# Patient Record
Sex: Female | Born: 1955
Health system: Southern US, Community
[De-identification: ages and names within clinical notes are randomized; demographics above are authoritative.]

## PROBLEM LIST (undated history)

## (undated) DIAGNOSIS — Z148 Genetic carrier of other disease: Secondary | ICD-10-CM

## (undated) DIAGNOSIS — J45909 Unspecified asthma, uncomplicated: Secondary | ICD-10-CM

## (undated) DIAGNOSIS — K5900 Constipation, unspecified: Secondary | ICD-10-CM

## (undated) HISTORY — PX: APPENDECTOMY: SHX54

## (undated) HISTORY — PX: ABDOMINAL HYSTERECTOMY: SHX81

---

## 2020-11-01 ENCOUNTER — Encounter (HOSPITAL_COMMUNITY)
Admission: EM | Disposition: A | Discharge status: Home / Self Care | Payer: Self-pay | Source: Home / Self Care | Attending: Family Medicine

## 2020-11-01 ENCOUNTER — Emergency Department (HOSPITAL_COMMUNITY): Payer: Medicare Other

## 2020-11-01 ENCOUNTER — Inpatient Hospital Stay (HOSPITAL_COMMUNITY): Payer: Medicare Other | Admitting: Anesthesiology

## 2020-11-01 ENCOUNTER — Inpatient Hospital Stay (HOSPITAL_COMMUNITY): Payer: Medicare Other

## 2020-11-01 ENCOUNTER — Inpatient Hospital Stay (HOSPITAL_COMMUNITY)
Admission: EM | Admit: 2020-11-01 | Discharge: 2020-11-07 | DRG: 331 | Disposition: A | Discharge status: Home / Self Care | Payer: Medicare Other | Attending: General Surgery | Admitting: General Surgery

## 2020-11-01 ENCOUNTER — Encounter (HOSPITAL_COMMUNITY): Payer: Self-pay

## 2020-11-01 ENCOUNTER — Other Ambulatory Visit: Payer: Self-pay

## 2020-11-01 DIAGNOSIS — Z79899 Other long term (current) drug therapy: Secondary | ICD-10-CM

## 2020-11-01 DIAGNOSIS — K562 Volvulus: Principal | ICD-10-CM | POA: Diagnosis present

## 2020-11-01 DIAGNOSIS — Z7951 Long term (current) use of inhaled steroids: Secondary | ICD-10-CM | POA: Diagnosis not present

## 2020-11-01 DIAGNOSIS — Z20822 Contact with and (suspected) exposure to covid-19: Secondary | ICD-10-CM | POA: Diagnosis present

## 2020-11-01 DIAGNOSIS — Z90710 Acquired absence of both cervix and uterus: Secondary | ICD-10-CM

## 2020-11-01 DIAGNOSIS — J4520 Mild intermittent asthma, uncomplicated: Secondary | ICD-10-CM | POA: Diagnosis present

## 2020-11-01 DIAGNOSIS — Z90721 Acquired absence of ovaries, unilateral: Secondary | ICD-10-CM | POA: Diagnosis not present

## 2020-11-01 DIAGNOSIS — K5900 Constipation, unspecified: Secondary | ICD-10-CM | POA: Diagnosis not present

## 2020-11-01 DIAGNOSIS — J45909 Unspecified asthma, uncomplicated: Secondary | ICD-10-CM | POA: Diagnosis present

## 2020-11-01 DIAGNOSIS — K219 Gastro-esophageal reflux disease without esophagitis: Secondary | ICD-10-CM | POA: Diagnosis present

## 2020-11-01 DIAGNOSIS — Z91011 Allergy to milk products: Secondary | ICD-10-CM

## 2020-11-01 DIAGNOSIS — Z86010 Personal history of colonic polyps: Secondary | ICD-10-CM | POA: Diagnosis not present

## 2020-11-01 DIAGNOSIS — Z148 Genetic carrier of other disease: Secondary | ICD-10-CM

## 2020-11-01 DIAGNOSIS — K581 Irritable bowel syndrome with constipation: Secondary | ICD-10-CM | POA: Diagnosis present

## 2020-11-01 DIAGNOSIS — Q992 Fragile X chromosome: Secondary | ICD-10-CM | POA: Diagnosis not present

## 2020-11-01 HISTORY — PX: FLEXIBLE SIGMOIDOSCOPY: SHX5431

## 2020-11-01 HISTORY — DX: Unspecified asthma, uncomplicated: J45.909

## 2020-11-01 HISTORY — DX: Genetic carrier of other disease: Z14.8

## 2020-11-01 HISTORY — DX: Constipation, unspecified: K59.00

## 2020-11-01 LAB — COMPREHENSIVE METABOLIC PANEL
ALT: 19 U/L (ref 0–44)
AST: 25 U/L (ref 15–41)
Albumin: 3.8 g/dL (ref 3.5–5.0)
Alkaline Phosphatase: 46 U/L (ref 38–126)
Anion gap: 10 (ref 5–15)
BUN: 12 mg/dL (ref 8–23)
CO2: 27 mmol/L (ref 22–32)
Calcium: 8.4 mg/dL — ABNORMAL LOW (ref 8.9–10.3)
Chloride: 105 mmol/L (ref 98–111)
Creatinine, Ser: 0.64 mg/dL (ref 0.44–1.00)
GFR, Estimated: >60 mL/min (ref >60)
Glucose, Bld: 109 mg/dL — ABNORMAL HIGH (ref 70–99)
Potassium: 3.8 mmol/L (ref 3.5–5.1)
Sodium: 142 mmol/L (ref 135–145)
Total Bilirubin: 0.6 mg/dL (ref 0.3–1.2)
Total Protein: 6.7 g/dL (ref 6.5–8.1)

## 2020-11-01 LAB — CBC WITH DIFFERENTIAL/PLATELET
Abs Immature Granulocytes: 0.02 10*3/uL (ref 0.00–0.07)
Basophils Absolute: 0 10*3/uL (ref 0.0–0.1)
Basophils Relative: 1 %
Eosinophils Absolute: 0 10*3/uL (ref 0.0–0.5)
Eosinophils Relative: 0 %
HCT: 42.5 % (ref 36.0–46.0)
Hemoglobin: 13.3 g/dL (ref 12.0–15.0)
Immature Granulocytes: 0 %
Lymphocytes Relative: 10 %
Lymphs Abs: 0.7 10*3/uL (ref 0.7–4.0)
MCH: 31.7 pg (ref 26.0–34.0)
MCHC: 31.3 g/dL (ref 30.0–36.0)
MCV: 101.2 fL — ABNORMAL HIGH (ref 80.0–100.0)
Monocytes Absolute: 0.4 10*3/uL (ref 0.1–1.0)
Monocytes Relative: 5 %
Neutro Abs: 6.5 10*3/uL (ref 1.7–7.7)
Neutrophils Relative %: 84 %
Platelets: 260 10*3/uL (ref 150–400)
RBC: 4.2 MIL/uL (ref 3.87–5.11)
RDW: 12.1 % (ref 11.5–15.5)
WBC: 7.7 10*3/uL (ref 4.0–10.5)
nRBC: 0 % (ref 0.0–0.2)

## 2020-11-01 LAB — RESP PANEL BY RT-PCR (FLU A&B, COVID) ARPGX2
Influenza A by PCR: NEGATIVE
Influenza B by PCR: NEGATIVE
SARS Coronavirus 2 by RT PCR: NEGATIVE

## 2020-11-01 LAB — LIPASE, BLOOD: Lipase: 33 U/L (ref 11–51)

## 2020-11-01 LAB — HIV ANTIBODY (ROUTINE TESTING W REFLEX): HIV Screen 4th Generation wRfx: NONREACTIVE

## 2020-11-01 SURGERY — SIGMOIDOSCOPY, FLEXIBLE
Anesthesia: General

## 2020-11-01 SURGERY — SIGMOIDOSCOPY, FLEXIBLE

## 2020-11-01 MED ORDER — DEXTROSE-NACL 5-0.45 % IV SOLN: INTRAVENOUS | Status: DC

## 2020-11-01 MED ORDER — MIDAZOLAM HCL 2 MG/2ML IJ SOLN
INTRAMUSCULAR | Status: AC
  Filled 2020-11-01: qty 2

## 2020-11-01 MED ORDER — SODIUM CHLORIDE 0.9 % IV SOLN: INTRAVENOUS | Status: DC

## 2020-11-01 MED ORDER — ALBUTEROL SULFATE HFA 108 (90 BASE) MCG/ACT IN AERS: 2.0000 | INHALATION_SPRAY | RESPIRATORY_TRACT | Status: DC | PRN

## 2020-11-01 MED ORDER — POLYETHYLENE GLYCOL 3350 17 G PO PACK
17.0000 g | PACK | Freq: Every day | ORAL | Status: DC | PRN
Start: 2020-11-01 — End: 2020-11-07

## 2020-11-01 MED ORDER — BISACODYL 10 MG RE SUPP: 10.0000 mg | Freq: Every day | RECTAL | Status: DC | PRN

## 2020-11-01 MED ORDER — SODIUM CHLORIDE 0.9 % IV SOLN
250.0000 mL | INTRAVENOUS | Status: DC | PRN
  Administered 2020-11-04: 250 mL via INTRAVENOUS

## 2020-11-01 MED ORDER — SODIUM CHLORIDE 0.9 % IV SOLN
1000.0000 mL | Freq: Once | INTRAVENOUS | Status: AC
  Administered 2020-11-01: 1000 mL via INTRAVENOUS

## 2020-11-01 MED ORDER — METOCLOPRAMIDE HCL 5 MG/ML IJ SOLN
5.0000 mg | Freq: Once | INTRAMUSCULAR | Status: AC
  Administered 2020-11-01: 5 mg via INTRAVENOUS
  Filled 2020-11-01: qty 2

## 2020-11-01 MED ORDER — FENTANYL CITRATE (PF) 100 MCG/2ML IJ SOLN: 25.0000 ug | INTRAMUSCULAR | Status: DC | PRN

## 2020-11-01 MED ORDER — PROPOFOL 10 MG/ML IV BOLUS
INTRAVENOUS | Status: AC
  Filled 2020-11-01: qty 40

## 2020-11-01 MED ORDER — DICYCLOMINE HCL 10 MG PO CAPS
10.0000 mg | ORAL_CAPSULE | Freq: Once | ORAL | Status: AC
  Administered 2020-11-01: 10 mg via ORAL
  Filled 2020-11-01: qty 1

## 2020-11-01 MED ORDER — FENTANYL CITRATE (PF) 100 MCG/2ML IJ SOLN
INTRAMUSCULAR | Status: AC
  Filled 2020-11-01: qty 2

## 2020-11-01 MED ORDER — HYDROMORPHONE HCL 1 MG/ML IJ SOLN
1.0000 mg | INTRAMUSCULAR | Status: DC | PRN
  Administered 2020-11-01: 1 mg via INTRAVENOUS
  Filled 2020-11-01: qty 1

## 2020-11-01 MED ORDER — SIMETHICONE 40 MG/0.6ML PO SUSP
ORAL | Status: AC
  Filled 2020-11-01: qty 0.6

## 2020-11-01 MED ORDER — ONDANSETRON HCL 4 MG PO TABS: 4.0000 mg | ORAL_TABLET | Freq: Four times a day (QID) | ORAL | Status: DC | PRN

## 2020-11-01 MED ORDER — SODIUM CHLORIDE 0.9% FLUSH
3.0000 mL | Freq: Two times a day (BID) | INTRAVENOUS | Status: DC
  Administered 2020-11-02 – 2020-11-06 (×6): 3 mL via INTRAVENOUS

## 2020-11-01 MED ORDER — ADULT MULTIVITAMIN W/MINERALS CH
1.0000 | ORAL_TABLET | Freq: Every day | ORAL | Status: DC
  Administered 2020-11-04 – 2020-11-07 (×4): 1 via ORAL
  Filled 2020-11-01 (×5): qty 1

## 2020-11-01 MED ORDER — IOHEXOL 300 MG/ML  SOLN
100.0000 mL | Freq: Once | INTRAMUSCULAR | Status: AC | PRN
  Administered 2020-11-01: 100 mL via INTRAVENOUS

## 2020-11-01 MED ORDER — CHOLECALCIFEROL 10 MCG (400 UNIT) PO TABS
400.0000 [IU] | ORAL_TABLET | Freq: Every day | ORAL | Status: DC
  Administered 2020-11-02 – 2020-11-07 (×5): 400 [IU] via ORAL
  Filled 2020-11-01 (×5): qty 1

## 2020-11-01 MED ORDER — SODIUM CHLORIDE 0.9% FLUSH: 3.0000 mL | INTRAVENOUS | Status: DC | PRN

## 2020-11-01 MED ORDER — LACTATED RINGERS IV SOLN
INTRAVENOUS | Status: DC
  Administered 2020-11-01: 1000 mL via INTRAVENOUS

## 2020-11-01 MED ORDER — ONDANSETRON HCL 4 MG/2ML IJ SOLN: 4.0000 mg | Freq: Four times a day (QID) | INTRAMUSCULAR | Status: DC | PRN

## 2020-11-01 MED ORDER — SODIUM CHLORIDE 0.9% FLUSH
3.0000 mL | Freq: Two times a day (BID) | INTRAVENOUS | Status: DC
  Administered 2020-11-01 – 2020-11-07 (×9): 3 mL via INTRAVENOUS

## 2020-11-01 MED ORDER — ACETAMINOPHEN 650 MG RE SUPP: 650.0000 mg | Freq: Four times a day (QID) | RECTAL | Status: DC | PRN

## 2020-11-01 MED ORDER — ACETAMINOPHEN 325 MG PO TABS
650.0000 mg | ORAL_TABLET | Freq: Four times a day (QID) | ORAL | Status: DC | PRN
  Administered 2020-11-04 – 2020-11-06 (×8): 650 mg via ORAL
  Filled 2020-11-01 (×8): qty 2

## 2020-11-01 MED ORDER — TRAZODONE HCL 50 MG PO TABS: 50.0000 mg | ORAL_TABLET | Freq: Every evening | ORAL | Status: DC | PRN

## 2020-11-01 MED ORDER — VITAMIN B-12 1000 MCG PO TABS
500.0000 ug | ORAL_TABLET | Freq: Every day | ORAL | Status: DC
  Administered 2020-11-02 – 2020-11-07 (×5): 500 ug via ORAL
  Filled 2020-11-01 (×5): qty 1

## 2020-11-01 MED ORDER — STERILE WATER FOR IRRIGATION IR SOLN
Status: DC | PRN
  Administered 2020-11-01: 200 mL

## 2020-11-01 MED ORDER — ALBUTEROL SULFATE (2.5 MG/3ML) 0.083% IN NEBU: 2.5000 mg | INHALATION_SOLUTION | RESPIRATORY_TRACT | Status: DC | PRN

## 2020-11-01 NOTE — Consult Note (Signed)
@LOGO @   Referring Provider: , PA Primary Care Physician:  Pcp, No Primary Gastroenterologist:  Gastroenterology in [place]   Date of Admission: 11/01/20 Date of Consultation: 11/01/20  Reason for Consultation: Sigmoid volvulus  HPI:  Meredith Walter is a 65 y.o. year old female from 76 traveling to [place] with history of chronic constipation followed by gastroenterology in [place] presenting to the emergency room due to obstipation.  Patient reports having increased constipation a few weeks ago.  She was advised to add mag citrate and increase Linzess from 72 mcg to 145 mcg twice daily.  This caused severe diarrhea, so she backed off to Linzess 72 mcg again.  This caused constipation.  Medication was switched to lubiprostone 8 mg twice daily last Thursday and she has not had a good BM since then.  2 days ago, she passed clear liquid per her rectum x2.  Yesterday, she passed no stool or gas.  Today, she has not passed any gas.  She had worsening abdominal distention and abdominal pain earlier today, so she came to the emergency room.  Just a few moments before I came to see her, she states she did pass a small amount of mushy stool.  No BRBPR or melena.  Continues to be very distended.  No significant abdominal pain at this time.  States she just feels that she is going to pop.  No nausea or vomiting, fever, or chills.   Had a colonoscopy 3 years ago with colon polyps.  She has history of colon polyps. She was told to repeat colonosocpy in 3 years.   Nothing to eat since yesterday afternoon. Few sips of water today.    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   CT A/P with contrast today with findings consistent with sigmoid volvulus, markedly distended colon above the volvulus but no findings of perforation or pneumatosis.   Past Medical History:  Diagnosis Date  . Asthma   . Carrier of fragile X  chromosome    Per patient, she is advised to avoid general anesthesia.  . Constipation     Past Surgical History:  Procedure Laterality Date  . ABDOMINAL HYSTERECTOMY     per patient, she had right ovary removed only.   . APPENDECTOMY      Prior to Admission medications   Medication Sig Start Date End Date Taking? Authorizing Provider  lubiprostone (AMITIZA) 8 MCG capsule Take 8 mcg by mouth 2 (two) times daily. 10/27/20  Yes [provider]  Multiple Vitamin (MULTIVITAMIN) tablet Take 1 tablet by mouth daily.   Yes [provider]  omeprazole (PRILOSEC) 20 MG capsule Take 20 mg by mouth daily. 10/18/20  Yes [provider]  QVAR REDIHALER 80 MCG/ACT inhaler Inhale 1-2 puffs into the lungs in the morning and at bedtime. 09/02/20  Yes [provider]  risedronate (ACTONEL) 150 MG tablet Take 150 mg by mouth every 30 (thirty) days. 09/20/20  Yes [provider]  vitamin B-12 (CYANOCOBALAMIN) 500 MCG tablet Take 500 mcg by mouth daily.   Yes [provider]  VITAMIN D PO Take 1 tablet by mouth daily.   Yes [provider]    Current Facility-Administered Medications  Medication Dose Route Frequency Provider Last Rate Last Admin  . dextrose 5 %-0.45 % sodium chloride infusion   Intravenous Continuous 11/18/20, MD 125 mL/hr at 11/01/20 1239 New Bag at 11/01/20 1239  .  HYDROmorphone (DILAUDID) injection 1 mg  1 mg Intravenous Q3H PRN Emokpae, Courage, MD      . ondansetron (ZOFRAN) injection 4 mg  4 mg Intravenous Q6H PRN Emokpae, Courage, MD       Current Outpatient Medications  Medication Sig Dispense Refill  . lubiprostone (AMITIZA) 8 MCG capsule Take 8 mcg by mouth 2 (two) times daily.    . Multiple Vitamin (MULTIVITAMIN) tablet Take 1 tablet by mouth daily.    Marland Kitchen omeprazole (PRILOSEC) 20 MG capsule Take 20 mg by mouth daily.    Marland Kitchen QVAR REDIHALER 80 MCG/ACT inhaler Inhale 1-2 puffs into the lungs in the morning and at  bedtime.    . risedronate (ACTONEL) 150 MG tablet Take 150 mg by mouth every 30 (thirty) days.    . vitamin B-12 (CYANOCOBALAMIN) 500 MCG tablet Take 500 mcg by mouth daily.    Marland Kitchen VITAMIN D PO Take 1 tablet by mouth daily.      Allergies as of 11/01/2020 - Review Complete 11/01/2020  Allergen Reaction Noted  . Dairycare [lactase-lactobacillus]  11/01/2020    Family History  Problem Relation Age of Onset  . Colon cancer Mother        diagnosed in her 71s  . Colon cancer Father        diagnosed in his 45s    Social History   Socioeconomic History  . Marital status: Married    Spouse name: Not on file  . Number of children: Not on file  . Years of education: Not on file  . Highest education level: Not on file  Occupational History  . Not on file  Tobacco Use  . Smoking status: Never Smoker  . Smokeless tobacco: Never Used  Vaping Use  . Vaping Use: Never used  Substance and Sexual Activity  . Alcohol use: Never  . Drug use: Never  . Sexual activity: Yes  Other Topics Concern  . Not on file  Social History Narrative  . Not on file   Social Determinants of Health   Financial Resource Strain: Not on file  Food Insecurity: Not on file  Transportation Needs: Not on file  Physical Activity: Not on file  Stress: Not on file  Social Connections: Not on file  Intimate Partner Violence: Not on file    Review of Systems: Gen: Denies fever, chills, cold or flulike symptoms CV: Denies chest pain or heart palpitations. Resp: Denies shortness of breath or cough. GI: See HPI GU : Denies urinary burning, urinary frequency, urinary incontinence.  MS: Denies joint pain Derm: Denies rash Psych: Denies depression or anxiety Heme: See HPI  Physical Exam: Vital signs in last 24 hours: Temp:  [97.2 F (36.2 C)] 97.2 F (36.2 C) (03/15 0853) Pulse Rate:  [60-77] 60 (03/15 1228) Resp:  [14-20] 19 (03/15 1228) BP: (112-170)/(65-151) 112/95 (03/15 1228) SpO2:  [99 %-100 %] 99  % (03/15 1228) Weight:  [45.4 kg] 45.4 kg (03/15 0854)   General:   Alert,  Well-developed, well-nourished, pleasant and cooperative in NAD Head:  Normocephalic and atraumatic. Eyes:  Sclera clear, no icterus.   Conjunctiva pink. Ears:  Normal auditory acuity. Lungs:  Clear throughout to auscultation.   No wheezes, crackles, or rhonchi. No acute distress. Heart:  Regular rate and rhythm; no murmurs, clicks, rubs,  or gallops. Abdomen:  Hypoactive bowel sounds. Abdomen is distended and moderately tense. No TTP.  Without guarding, and without rebound. No masses appreciated. Due to distension, did not evaluate for hepatosplenomegaly.  Rectal:  Deferred until time of flex sig.   Msk:  Symmetrical without gross deformities.  Extremities:  Without edema. Neurologic:  Alert and  oriented x4;  grossly normal neurologically. Skin:  Intact without significant lesions or rashes. Psych: Normal mood and affect.  Intake/Output from previous day: No intake/output data recorded. Intake/Output this shift: Total I/O In: 1000 [I.V.:1000] Out: -   Lab Results: Recent Labs    11/01/20 0954  WBC 7.7  HGB 13.3  HCT 42.5  PLT 260   BMET Recent Labs    11/01/20 0954  NA 142  K 3.8  CL 105  CO2 27  GLUCOSE 109*  BUN 12  CREATININE 0.64  CALCIUM 8.4*   LFT Recent Labs    11/01/20 0954  PROT 6.7  ALBUMIN 3.8  AST 25  ALT 19  ALKPHOS 46  BILITOT 0.6   Studies/Results: CT Abdomen Pelvis W Contrast  Result Date: 11/01/2020 CLINICAL DATA:  Abdominal distension. EXAM: CT ABDOMEN AND PELVIS WITH CONTRAST TECHNIQUE: Multidetector CT imaging of the abdomen and pelvis was performed using the standard protocol following bolus administration of intravenous contrast. CONTRAST:  100mL OMNIPAQUE IOHEXOL 300 MG/ML  SOLN COMPARISON:  None. FINDINGS: Lower chest: The lung bases are clear of an acute process. No pleural effusions or pulmonary lesions. The heart is normal in size. No pericardial  effusion. There is a moderate-sized hiatal hernia noted. Hepatobiliary: No hepatic lesions or intrahepatic biliary dilatation. The gallbladder appears normal. No common bile duct dilatation. Pancreas: No mass, inflammation or ductal dilatation. Spleen: Normal size.  No focal lesions. Adrenals/Urinary Tract: The adrenal glands and kidneys are unremarkable. The bladder is unremarkable. Stomach/Bowel: The stomach is relatively decompressed. The small bowel is unremarkable. No distension or obstruction. Markedly distended colon measuring up to 8 cm in some areas. There is moderate stool and some fluid scattered in the colon. Low lying cecum deep in the pelvis and displacing the rectum to the left. The mid sigmoid colon and rectum are decompressed. There is a definite transition zone at the iliac crest midline with swirling mesenteric vessels and twisting of the sigmoid colon consistent with sigmoid volvulus. Vascular/Lymphatic: The aorta and branch vessels are patent. The major venous structures are patent. No mesenteric or retroperitoneal mass or adenopathy. Reproductive: The uterus and ovaries are grossly normal. A small calcified fibroid is noted. Other: No free air or free fluid is identified. Musculoskeletal: No significant bony findings. Degenerative anterolisthesis of L4 and L5 and associated advanced degenerative disc disease at L4-5 and L5-S1. IMPRESSION: 1. CT findings consistent with a sigmoid volvulus. Markedly distended colon above the volvulus but no findings for perforation or pneumatosis. Recommend surgical consultation. 2. Moderate-sized hiatal hernia. Electronically Signed   By: Rudie MeyerP.  Gallerani M.D.   On: 11/01/2020 11:12    Impression: 65 year old female with history significant for Fragile X carrier state, asthma, and chronic constipation now presenting to the emergency room with worsening constipation/obstipation, abdominal distention, abdominal pain, and found to have sigmoid volvulus on CT with  markedly distended colon above the volvulus but no findings of perforation or pneumatosis. CBC, CMP with no significant abnormalities. Patient reports last good BM was about 1 week ago despite taking Amitiza 8 mcg twice daily and mag citrate.  Has not passed any gas in 2 days.  Since presenting to the emergency room, she did pass a small amount of mushy stool, but continues to have significant abdominal distention.  Suspect this is likely liquid stool that was able to  pass through volvulus.  She has not had anything to eat since yesterday afternoon.  Few sips of water today.  She is not on any anticoagulants or antiplatelet medications. We will plan to proceed with flex sig today.   She is COVID negative.   Plan: 1.  Keep NPO 2.  Flex sig today with propofol with Dr. Levon Hedger. The risks, benefits, and alternatives have been discussed with the patient in detail. The patient states understanding and desires to proceed.  3.  Tap water enemas x2 as tolerated for flex sig prep.     LOS: 0 days    11/01/2020, 12:58 PM   Ermalinda Memos, [facility]

## 2020-11-01 NOTE — Brief Op Note (Signed)
11/01/2020  5:34 PM  PATIENT:  Meredith Walter  65 y.o. female  PRE-OPERATIVE DIAGNOSIS:  volvulus  POST-OPERATIVE DIAGNOSIS:  colonic volvulus  PROCEDURE:  Procedure(s): FLEXIBLE SIGMOIDOSCOPY (N/A)  SURGEON:  Surgeon(s) and Role:    * Dolores Frame, MD - Primary  Patient underwent unsedated flexible sigmoidoscopy.  Patient tolerated procedure well and did not have any pain.  Volvulus was found around 20 cm from the anal verge.  This could be easily traversed, there was mild dilation of the colon upstream of the area of bulbous.  Air was suctioned and patient underwent endoscopic reduction multiple times with improvement of her symptoms and distention.  RECOMMENDATIONS: -Return patient to hospital ward -Keep n.p.o. for now -Continue IV fluids -STAT KUB -Surgical management of volvulus  Katrinka Blazing, MD Gastroenterology and Hepatology [facility]

## 2020-11-01 NOTE — Progress Notes (Signed)
[facility]  Needs sigmoidoscopy to see if can reduce the volvulus. If unable to reduce will need surgery for volvulus. If reduces need to at least discuss that this can recur with the patient and monitor her for period of time after her sigmoidoscopy to ensure she does not immediate recur.   She is from out of town so if it is reduced she could opt to go back home for surgery versus surgery here to prevent recurrence.  GI consulted for sigmoidoscopy, Hospitalist for admission, will likely see patient tomorrow to discuss further    Algis Greenhouse, MD [facility] [address] [place], [place] [address] [phone] (office)

## 2020-11-01 NOTE — Consult Note (Addendum)
[facility] Consult  Reason for Consult: Sigmoid volvulus  Referring Physician:  Dr. Mariea Clonts, Dr. Levon Hedger   Chief Complaint    Abdominal Pain      HPI: Meredith Walter is a 65 y.o. 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  She is traveling from [place] to [place] to visit a brother who also was a carrier of Fragile X premutation and has had progression to  . Fragile associated tremor/ ataxia syndrome and has another brother that died from this.  She has been told to avoid general anesthesia if possible.    She had successfully just undergone a flexible sigmoidoscopy un-sedated with Dr. Levon Hedger and is doing well. She has no abdominal pain complaints and is less distended.  I have spoken with her about the volvulus and the recurrence, and we discussed the standard of care being a surgery at the index hospitalization or very soon there after once a bowel preparation can be completed. Given the size of her colon she has a high likelihood of recurring.  I have discussed that we do these procedures regularly at [facility], removing portions of colon. We discussed that Dr. Alva Garnet, anesthesia, is not comfortable giving her general anesthesia at [facility].  I have reviewed what I can find about the premutation and anesthesia and it looks like there is progressive cognitive decline, ataxia, and gait issues with the progression to the Fragile X associated syndrome. It appears that this is more associated with inhaled anesthetics.    Past Medical History:  Diagnosis Date  . Asthma   . 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  . Constipation     Past Surgical History:  Procedure Laterality Date  . ABDOMINAL HYSTERECTOMY     per patient, she had right ovary removed only.   . APPENDECTOMY      Family  History  Problem Relation Age of Onset  . Colon cancer Mother        diagnosed in her 31s  . Colon cancer Father        diagnosed in his 1s    Social History   Tobacco Use  . Smoking status: Never Smoker  . Smokeless tobacco: Never Used  Vaping Use  . Vaping Use: Never used  Substance Use Topics  . Alcohol use: Never  . Drug use: Never    Medications:  I have reviewed the patient's current medications. Prior to Admission:  Medications Prior to Admission  Medication Sig Dispense Refill Last Dose  . lubiprostone (AMITIZA) 8 MCG capsule Take 8 mcg by mouth 2 (two) times daily.   11/01/2020  . Multiple Vitamin (MULTIVITAMIN) tablet Take 1 tablet by mouth daily.   11/01/2020 at Unknown time  . omeprazole (PRILOSEC) 20 MG capsule Take 20 mg by mouth daily.   11/01/2020  . QVAR REDIHALER 80 MCG/ACT inhaler Inhale 1-2 puffs into the lungs in the morning and at bedtime.   11/01/2020  . risedronate (ACTONEL) 150 MG tablet Take 150 mg by mouth every 30 (thirty) days.   Past Month  . vitamin B-12 (CYANOCOBALAMIN) 500 MCG tablet Take 500 mcg by mouth daily.   11/01/2020 at Unknown time  . VITAMIN D PO Take 1 tablet by mouth daily.   11/01/2020 at Unknown time   Scheduled: . [MAR Hold] cholecalciferol  400 Units Oral Daily  . [MAR Hold] multivitamin with minerals  1 tablet Oral Daily  . simethicone      . [MAR Hold] sodium chloride flush  3 mL Intravenous Q12H  . [MAR Hold] sodium chloride flush  3 mL Intravenous Q12H  . [MAR Hold] vitamin B-12  500 mcg Oral Daily   Continuous: . [MAR Hold] sodium chloride    . sodium chloride    . dextrose 5 % and 0.45% NaCl Stopped (11/01/20 1428)  . lactated ringers 1,000 mL (11/01/20 1640)   PRN:[MAR Hold] sodium chloride, [MAR Hold] acetaminophen **OR** [MAR Hold] acetaminophen, [MAR Hold] albuterol, [MAR Hold] bisacodyl, fentaNYL (SUBLIMAZE) injection, [MAR Hold]  HYDROmorphone (DILAUDID) injection, [MAR Hold] ondansetron **OR** [MAR Hold]  ondansetron (ZOFRAN) IV, [MAR Hold] polyethylene glycol, [MAR Hold] sodium chloride flush, [MAR Hold] traZODone  Allergies  Allergen Reactions  . Dairycare [Lactase-Lactobacillus]      ROS:  A comprehensive review of systems was negative except for: Gastrointestinal: positive for obstipation  Blood pressure 119/70, pulse 81, temperature 98.1 F (36.7 C), temperature source Oral, resp. rate 16, height 5\' 3"  (1.6 m), weight 45.4 kg, SpO2 99 %. Physical Exam Vitals reviewed.  Constitutional:      Appearance: She is well-developed and normal weight.  HENT:     Head: Normocephalic.  Eyes:     Extraocular Movements: Extraocular movements intact.  Cardiovascular:     Rate and Rhythm: Normal rate and regular rhythm.  Pulmonary:     Effort: Pulmonary effort is normal.  Abdominal:     General: There is no distension.     Palpations: Abdomen is soft.     Tenderness: There is no abdominal tenderness.  Musculoskeletal:     Comments: Moves all extremities   Skin:    General: Skin is warm and dry.  Neurological:     General: No focal deficit present.     Mental Status: She is alert and oriented to person, place, and time.  Psychiatric:        Mood and Affect: Mood normal.        Behavior: Behavior normal.     Results: Results for orders placed or performed during the hospital encounter of 11/01/20 (from the past 48 hour(s))  CBC with Differential     Status: Abnormal   Collection Time: 11/01/20  9:54 AM  Result Value Ref Range   WBC 7.7 4.0 - 10.5 K/uL   RBC 4.20 3.87 - 5.11 MIL/uL   Hemoglobin 13.3 12.0 - 15.0 g/dL   HCT 11/03/20 44.8 - 18.5 %   MCV 101.2 (H) 80.0 - 100.0 fL   MCH 31.7 26.0 - 34.0 pg   MCHC 31.3 30.0 - 36.0 g/dL   RDW 63.1 49.7 - 02.6 %   Platelets 260 150 - 400 K/uL   nRBC 0.0 0.0 - 0.2 %   Neutrophils Relative % 84 %   Neutro Abs 6.5 1.7 - 7.7 K/uL   Lymphocytes Relative 10 %   Lymphs Abs 0.7 0.7 - 4.0 K/uL   Monocytes Relative 5 %   Monocytes Absolute  0.4 0.1 - 1.0 K/uL   Eosinophils Relative 0 %   Eosinophils Absolute 0.0 0.0 - 0.5 K/uL   Basophils Relative 1 %   Basophils Absolute 0.0 0.0 - 0.1 K/uL   Immature Granulocytes 0 %   Abs Immature Granulocytes 0.02 0.00 - 0.07 K/uL    Comment: Performed at [facility], [address], [place], [place]  Comprehensive  metabolic panel     Status: Abnormal   Collection Time: 11/01/20  9:54 AM  Result Value Ref Range   Sodium 142 135 - 145 mmol/L   Potassium 3.8 3.5 - 5.1 mmol/L   Chloride 105 98 - 111 mmol/L   CO2 27 22 - 32 mmol/L   Glucose, Bld 109 (H) 70 - 99 mg/dL    Comment: Glucose reference range applies only to samples taken after fasting for at least 8 hours.   BUN 12 8 - 23 mg/dL   Creatinine, Ser 7.86 0.44 - 1.00 mg/dL   Calcium 8.4 (L) 8.9 - 10.3 mg/dL   Total Protein 6.7 6.5 - 8.1 g/dL   Albumin 3.8 3.5 - 5.0 g/dL   AST 25 15 - 41 U/L   ALT 19 0 - 44 U/L   Alkaline Phosphatase 46 38 - 126 U/L   Total Bilirubin 0.6 0.3 - 1.2 mg/dL   GFR, Estimated >76 >72 mL/min    Comment: (NOTE) Calculated using the CKD-EPI Creatinine Equation (2021)    Anion gap 10 5 - 15    Comment: Performed at [facility], [address], [place], [place] [address]  Lipase, blood     Status: None   Collection Time: 11/01/20  9:54 AM  Result Value Ref Range   Lipase 33 11 - 51 U/L    Comment: Performed at [facility], [address] [place], [place] [address]  Resp Panel by RT-PCR (Flu A&B, Covid) Nasopharyngeal Swab     Status: None   Collection Time: 11/01/20 11:38 AM   Specimen: Nasopharyngeal Swab; Nasopharyngeal(NP) swabs in vial transport medium  Result Value Ref Range   SARS Coronavirus 2 by RT PCR NEGATIVE NEGATIVE    Comment: (NOTE) SARS-CoV-2 target nucleic acids are NOT DETECTED.  The SARS-CoV-2 RNA is generally detectable in upper respiratory specimens during the acute phase of infection. The lowest concentration of SARS-CoV-2 viral copies this assay can detect is 138  copies/mL. A negative result does not preclude SARS-Cov-2 infection and should not be used as the sole basis for treatment or other patient management decisions. A negative result may occur with  improper specimen collection/handling, submission of specimen other than nasopharyngeal swab, presence of viral mutation(s) within the areas targeted by this assay, and inadequate number of viral copies(<138 copies/mL). A negative result must be combined with clinical observations, patient history, and epidemiological information. The expected result is Negative.  Fact Sheet for Patients:  BloggerCourse.com  Fact Sheet for Healthcare Providers:  SeriousBroker.it  This test is no t yet approved or cleared by the [place] FDA and  has been authorized for detection and/or diagnosis of SARS-CoV-2 by FDA under an Emergency Use Authorization (EUA). This EUA will remain  in effect (meaning this test can be used) for the duration of the COVID-19 declaration under Section 564(b)(1) of the Act, 21 U.S.C.section 360bbb-3(b)(1), unless the authorization is terminated  or revoked sooner.       Influenza A by PCR NEGATIVE NEGATIVE   Influenza B by PCR NEGATIVE NEGATIVE    Comment: (NOTE) The Xpert Xpress SARS-CoV-2/FLU/RSV plus assay is intended as an aid in the diagnosis of influenza from Nasopharyngeal swab specimens and should not be used as a sole basis for treatment. Nasal washings and aspirates are unacceptable for Xpert Xpress SARS-CoV-2/FLU/RSV testing.  Fact Sheet for Patients: BloggerCourse.com  Fact Sheet for Healthcare Providers: SeriousBroker.it  This test is not yet approved or cleared by the [place] and  has been authorized for detection and/or diagnosis of SARS-CoV-2 by FDA under an Emergency Use Authorization (EUA). This EUA will remain in effect (meaning this test  can be used) for the duration of the COVID-19 declaration under Section 564(b)(1) of the Act, 21 U.S.C. section 360bbb-3(b)(1), unless the authorization is terminated or revoked.  Performed at [facility], [address] [place], [place] [address]    Personally reviewed- large dilated, redundant sigmoid colon with swirling mesentery, consistent with volvulus  CT Abdomen Pelvis W Contrast  Result Date: 11/01/2020 CLINICAL DATA:  Abdominal distension. EXAM: CT ABDOMEN AND PELVIS WITH CONTRAST TECHNIQUE: Multidetector CT imaging of the abdomen and pelvis was performed using the standard protocol following bolus administration of intravenous contrast. CONTRAST:  OMNIPAQUE IOHEXOL 300 MG/ML  SOLN COMPARISON:  None. FINDINGS: Lower chest: The lung bases are clear of an acute process. No pleural effusions or pulmonary lesions. The heart is normal in size. No pericardial effusion. There is a moderate-sized hiatal hernia noted. Hepatobiliary: No hepatic lesions or intrahepatic biliary dilatation. The gallbladder appears normal. No common bile duct dilatation. Pancreas: No mass, inflammation or ductal dilatation. Spleen: Normal size.  No focal lesions. Adrenals/Urinary Tract: The adrenal glands and kidneys are unremarkable. The bladder is unremarkable. Stomach/Bowel: The stomach is relatively decompressed. The small bowel is unremarkable. No distension or obstruction. Markedly distended colon measuring up to 8 cm in some areas. There is moderate stool and some fluid scattered in the colon. Low lying cecum deep in the pelvis and displacing the rectum to the left. The mid sigmoid colon and rectum are decompressed. There is a definite transition zone at the iliac crest midline with swirling mesenteric vessels and twisting of the sigmoid colon consistent with sigmoid volvulus. Vascular/Lymphatic: The aorta and branch vessels are patent. The major venous structures are patent. No mesenteric or retroperitoneal  mass or adenopathy. Reproductive: The uterus and ovaries are grossly normal. A small calcified fibroid is noted. Other: No free air or free fluid is identified. Musculoskeletal: No significant bony findings. Degenerative anterolisthesis of L4 and L5 and associated advanced degenerative disc disease at L4-5 and L5-S1. IMPRESSION: 1. CT findings consistent with a sigmoid volvulus. Markedly distended colon above the volvulus but no findings for perforation or pneumatosis. Recommend surgical consultation. 2. Moderate-sized hiatal hernia. Electronically Signed   By: Rudie Meyer M.D.   On: 11/01/2020 11:12    Assessment & Plan:  Arabella Revelle is a 65 y.o. 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 She is having no pain. I have discussed the issues with the patient and Dr. Levon Hedger at length. She is at risk for recurrence and recommendations would be to do a partial colectomy to reduce the risk of complications from a volvulus that results in ischemia and or perforation in the future. Discussed that with an emergency surgery she would get an ostomy versus with a planned procedure she is able to get a bowel preparation and likely get an anastomosis.    -Discussed that anesthesia is inevitable and we cannot change her risk of Fragile X premutation -Discussed with the patient and Dr. Levon Hedger that Dr. Alva Garnet, anesthesia does not want to proceed with any general anesthesia at [facility] due to the risk of complications related to Fragile X premutation and that this is out of my control  -At this time she is going to likely be transferred for further evaluation and will undergo further evaluation and discussion   All questions were answered to  the satisfaction of the patient who expressed understanding of my limitations given anesthesia's position.     Meredith Walter 11/01/2020, 5:30 PM

## 2020-11-01 NOTE — Progress Notes (Signed)
Discussed with Dr Ivar Drape from [facility]

## 2020-11-01 NOTE — H&P (Signed)
Patient Demographics:    Meredith Walter, is a 65 y.o. 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  -Underwent sigmoidoscopy without sedation with reduction of volvulus -Patient is less distended and no significant abdominal pain postprocedure -KUB postprocedure shows improvement -General surgeon is concerned given the size of patient's colon/volvulus there is a high likelihood for recurrence -General surgeon recommends partial colectomy to reduce the risk of complications from a volvulus that results in ischemia and or perforation in the future.  As per general surgeon with an emergency surgery she would get an ostomy versus with a planned procedure she is able to get a bowel preparation and likely get an anastomosis -Anesthesia does not feel comfortable doing procedure at [facility] due to patient's history of Fragile X syndrome with risk for anesthesia complications resulting in tremor/ataxia/parkinsonian type side effects of anesthesia -Discussed with Dr. 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--Pt is at Risk for Tremor-Ataxia Syndrome if she gets Anesthesia--- patient has very strong family history of fragile X syndrome in her maternal grandpa, her mother and 2 brothers - 3) chronic constipation--- presumably IBS patient has had multiple colonoscopies and uses medications for ongoing constipation -Patient actually had a BM today (prior to today no bowel movement in a week-despite using mag citrate and other laxatives) - 4) history of asthma--no acute exacerbation okay to use as needed albuterol   Disposition/Need for in-Hospital Stay- patient unable to be discharged at this time due to --- sigmoid volvulus requiring GI and operative/surgical intervention and IV fluids  Status is: Inpatient  Remains inpatient appropriate because:Please see above   Dispo: The patient is from: Home              Anticipated d/c is to: Home              Anticipated d/c date is: 2 days              Patient currently is not medically stable to d/c. Barriers: Not Clinically Stable-    With History of - Reviewed by me  Past Medical History:  Diagnosis Date  .  Asthma   . 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  . Constipation       Past Surgical History:  Procedure Laterality Date  . ABDOMINAL HYSTERECTOMY     per patient, she had right ovary removed only.   . APPENDECTOMY        Chief Complaint  Patient presents with  . Abdominal Pain    Constipation      HPI:    Meredith Walter  is a 65 y.o. 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--Pt is at Risk for Tremor-Ataxia Syndrome if she gets Anesthesia--- patient has very strong family history of fragile X syndrome in her maternal grandpa, her mother and 2 brothers, mild intermittent asthma, chronic  constipation requiring medications and repeated colonoscopies--who was traveling from [place] to [place] and experienced abdominal pain and abdominal distention  -CT abdomen and pelvis at time pain consistent with sigmoid volvulus -Patient underwent sigmoidoscopy with reduction -General surgery recommends partial colectomy which cannot be done at [facility] as-anesthesia does not feel comfortable -Patient has been transferred to [facility] for further GI and general surgery evaluation -Dr. Jeani Hawking from GI service is aware -Creatinine is 0.64 -WBC 7.7, hemoglobin 13.3, platelets 260 --Additional history obtained from patient's husband at bedside No fever  Or chills  Patient actually had a BM today (prior to today no bowel movement in a week-despite using mag citrate and other laxatives) -No emesis -No chest pains, no palpitations no dizziness no shortness of breath    Review of systems:    In addition to the HPI above,   A full Review of  Systems was done, all other systems reviewed are negative except as noted above in HPI , .    Social History:  Reviewed by me    Social History   Tobacco Use  . Smoking status: Never Smoker  . Smokeless tobacco: Never Used  Substance Use Topics  . Alcohol use: Never       Family History :  Reviewed by me    Family History  Problem Relation Age of Onset  . Colon cancer Mother        diagnosed in her 38s  . Colon cancer Father        diagnosed in his 87s     Home Medications:   Prior to Admission medications   Medication Sig Start Date End Date Taking? Authorizing Provider  lubiprostone (AMITIZA) 8 MCG capsule Take 8 mcg by mouth 2 (two) times daily. 10/27/20  Yes [provider]  Multiple Vitamin (MULTIVITAMIN) tablet Take 1 tablet by mouth daily.   Yes [provider]  omeprazole (PRILOSEC) 20 MG capsule Take 20 mg by mouth daily. 10/18/20  Yes [provider]  QVAR REDIHALER 80 MCG/ACT inhaler Inhale  1-2 puffs into the lungs in the morning and at bedtime. 09/02/20  Yes [provider]  risedronate (ACTONEL) 150 MG tablet Take 150 mg by mouth every 30 (thirty) days. 09/20/20  Yes [provider]  vitamin B-12 (CYANOCOBALAMIN) 500 MCG tablet Take 500 mcg by mouth daily.   Yes [provider]  VITAMIN D PO Take 1 tablet by mouth daily.   Yes [provider]     Allergies:     Allergies  Allergen Reactions  . Dairycare [Lactase-Lactobacillus]      Physical Exam:   Vitals  Blood pressure 126/66, pulse 81,  temperature 98.1 F (36.7 C), temperature source Oral, resp. rate 17, height 5\' 3"  (1.6 m), weight 45.4 kg, SpO2 99 %.  Physical Examination: General appearance - alert, cooperative Mental status - alert, oriented to person, place, and time,  Eyes - sclera anicteric Neck - supple, no JVD elevation , Chest - clear  to auscultation bilaterally, symmetrical air movement,  Heart - S1 and S2 normal, regular  Abdomen -distended and uncomfortable with palpation, no CVA area tenderness  neurological - screening mental status exam normal, neck supple without rigidity, cranial nerves II through XII intact, DTR's normal and symmetric Extremities - no pedal edema noted, intact peripheral pulses  Skin - warm, dry   Data Review:    CBC Recent Labs  Lab 11/01/20 0954  WBC 7.7  HGB 13.3  HCT 42.5  PLT 260  MCV 101.2*  MCH 31.7  MCHC 31.3  RDW 12.1  LYMPHSABS 0.7  MONOABS 0.4  EOSABS 0.0  BASOSABS 0.0   ------------------------------------------------------------------------------------------------------------------  Chemistries  Recent Labs  Lab 11/01/20 0954  NA 142  K 3.8  CL 105  CO2 27  GLUCOSE 109*  BUN 12  CREATININE 0.64  CALCIUM 8.4*  AST 25  ALT 19  ALKPHOS 46  BILITOT 0.6   ------------------------------------------------------------------------------------------------------------------ estimated creatinine clearance is  50.2 mL/min (by C-G formula based on SCr of 0.64 mg/dL). ------------------------------------------------------------------------------------------------------------------ No results for input(s): TSH, T4TOTAL, T3FREE, THYROIDAB in the last 72 hours.  Invalid input(s): FREET3   Coagulation profile No results for input(s): INR, PROTIME in the last 168 hours. ------------------------------------------------------------------------------------------------------------------- No results for input(s): DDIMER in the last 72 hours. -------------------------------------------------------------------------------------------------------------------  Cardiac Enzymes No results for input(s): CKMB, TROPONINI, MYOGLOBIN in the last 168 hours.  Invalid input(s): CK ------------------------------------------------------------------------------------------------------------------ No results found for: BNP   ---------------------------------------------------------------------------------------------------------------  Urinalysis No results found for: COLORURINE, APPEARANCEUR, LABSPEC, PHURINE, GLUCOSEU, HGBUR, BILIRUBINUR, KETONESUR, PROTEINUR, UROBILINOGEN, NITRITE, LEUKOCYTESUR  ----------------------------------------------------------------------------------------------------------------   Imaging Results:    CT Abdomen Pelvis W Contrast  Result Date: 11/01/2020 CLINICAL DATA:  Abdominal distension. EXAM: CT ABDOMEN AND PELVIS WITH CONTRAST TECHNIQUE: Multidetector CT imaging of the abdomen and pelvis was performed using the standard protocol following bolus administration of intravenous contrast. CONTRAST:  OMNIPAQUE IOHEXOL 300 MG/ML  SOLN COMPARISON:  None. FINDINGS: Lower chest: The lung bases are clear of an acute process. No pleural effusions or pulmonary lesions. The heart is normal in size. No pericardial effusion. There is a moderate-sized hiatal hernia noted. Hepatobiliary: No  hepatic lesions or intrahepatic biliary dilatation. The gallbladder appears normal. No common bile duct dilatation. Pancreas: No mass, inflammation or ductal dilatation. Spleen: Normal size.  No focal lesions. Adrenals/Urinary Tract: The adrenal glands and kidneys are unremarkable. The bladder is unremarkable. Stomach/Bowel: The stomach is relatively decompressed. The small bowel is unremarkable. No distension or obstruction. Markedly distended colon measuring up to 8 cm in some areas. There is moderate stool and some fluid scattered in the colon. Low lying cecum deep in the pelvis and displacing the rectum to the left. The mid sigmoid colon and rectum are decompressed. There is a definite transition zone at the iliac crest midline with swirling mesenteric vessels and twisting of the sigmoid colon consistent with sigmoid volvulus. Vascular/Lymphatic: The aorta and branch vessels are patent. The major venous structures are patent. No mesenteric or retroperitoneal mass or adenopathy. Reproductive: The uterus and ovaries are grossly normal. A small calcified fibroid is noted. Other: No free air or free fluid is identified. Musculoskeletal: No significant bony findings.  Degenerative anterolisthesis of L4 and L5 and associated advanced degenerative disc disease at L4-5 and L5-S1. IMPRESSION: 1. CT findings consistent with a sigmoid volvulus. Markedly distended colon above the volvulus but no findings for perforation or pneumatosis. Recommend surgical consultation. 2. Moderate-sized hiatal hernia. Electronically Signed   By: Rudie Meyer M.D.   On: 11/01/2020 11:12    Radiological Exams on Admission: CT Abdomen Pelvis W Contrast  Result Date: 11/01/2020 CLINICAL DATA:  Abdominal distension. EXAM: CT ABDOMEN AND PELVIS WITH CONTRAST TECHNIQUE: Multidetector CT imaging of the abdomen and pelvis was performed using the standard protocol following bolus administration of intravenous contrast. CONTRAST:  OMNIPAQUE  IOHEXOL 300 MG/ML  SOLN COMPARISON:  None. FINDINGS: Lower chest: The lung bases are clear of an acute process. No pleural effusions or pulmonary lesions. The heart is normal in size. No pericardial effusion. There is a moderate-sized hiatal hernia noted. Hepatobiliary: No hepatic lesions or intrahepatic biliary dilatation. The gallbladder appears normal. No common bile duct dilatation. Pancreas: No mass, inflammation or ductal dilatation. Spleen: Normal size.  No focal lesions. Adrenals/Urinary Tract: The adrenal glands and kidneys are unremarkable. The bladder is unremarkable. Stomach/Bowel: The stomach is relatively decompressed. The small bowel is unremarkable. No distension or obstruction. Markedly distended colon measuring up to 8 cm in some areas. There is moderate stool and some fluid scattered in the colon. Low lying cecum deep in the pelvis and displacing the rectum to the left. The mid sigmoid colon and rectum are decompressed. There is a definite transition zone at the iliac crest midline with swirling mesenteric vessels and twisting of the sigmoid colon consistent with sigmoid volvulus. Vascular/Lymphatic: The aorta and branch vessels are patent. The major venous structures are patent. No mesenteric or retroperitoneal mass or adenopathy. Reproductive: The uterus and ovaries are grossly normal. A small calcified fibroid is noted. Other: No free air or free fluid is identified. Musculoskeletal: No significant bony findings. Degenerative anterolisthesis of L4 and L5 and associated advanced degenerative disc disease at L4-5 and L5-S1. IMPRESSION: 1. CT findings consistent with a sigmoid volvulus. Markedly distended colon above the volvulus but no findings for perforation or pneumatosis. Recommend surgical consultation. 2. Moderate-sized hiatal hernia. Electronically Signed   By: Rudie Meyer M.D.   On: 11/01/2020 11:12    DVT Prophylaxis -SCD   AM Labs Ordered, also please review Full Orders  Family  Communication: Admission, patients condition and plan of care including tests being ordered have been discussed with the patient and husband who indicate understanding and agree with the plan   Code Status - Full Code  Likely DC to home after resolution of volvulus  Condition   stable  Shon Hale M.D on 11/01/2020 at 5:52 PM Go to [email or web address] -  for contact info  [facility] - Office  [phone]

## 2020-11-01 NOTE — ED Triage Notes (Addendum)
Pt from [place] traveling to [place] has a history of constipation.  Pt has not has BM in 1 week.  History of constipation took Mag Citrate last night and did not work. Abd distended.  Pt takes Linzess for chronic constipation last week her prescription was adjusted due to prior week she had chronic diarrhea.

## 2020-11-01 NOTE — Anesthesia Preprocedure Evaluation (Signed)
Anesthesia Evaluation  Patient identified by MRN, date of birth, ID band Patient awake    Reviewed: Allergy & Precautions, NPO status , Patient's Chart, lab work & pertinent test results  Airway Mallampati: II  TM Distance: >3 FB Neck ROM: Full    Dental  (+) Dental Advisory Given, Caps   Pulmonary asthma ,    breath sounds clear to auscultation       Cardiovascular Exercise Tolerance: Good Normal cardiovascular exam Rhythm:Regular Rate:Normal     Neuro/Psych negative neurological ROS  negative psych ROS   GI/Hepatic Neg liver ROS, GERD  Medicated,  Endo/Other  negative endocrine ROS  Renal/GU negative Renal ROS     Musculoskeletal negative musculoskeletal ROS (+)   Abdominal   Peds  Hematology negative hematology ROS (+)   Anesthesia Other Findings   Reproductive/Obstetrics negative OB ROS                             Anesthesia Physical Anesthesia Plan  ASA: III and emergent  Anesthesia Plan: General   Post-op Pain Management:    Induction: Intravenous, Cricoid pressure planned and Rapid sequence  PONV Risk Score and Plan: Propofol infusion and Ondansetron  Airway Management Planned: Oral ETT  Additional Equipment:   Intra-op Plan:   Post-operative Plan: Extubation in OR  Informed Consent: I have reviewed the patients History and Physical, chart, labs and discussed the procedure including the risks, benefits and alternatives for the proposed anesthesia with the patient or authorized representative who has indicated his/her understanding and acceptance.     Dental advisory given  Plan Discussed with: CRNA and Surgeon  Anesthesia Plan Comments:         Anesthesia Quick Evaluation

## 2020-11-01 NOTE — Progress Notes (Signed)
I was informed about the patient earlier and the floor notified me about her arrival.  She was decompressed by Dr. Levon Hedger earlier this evening.  Her abdomen is not distended and she is not in pain.  It is tympanic.  Surgery evaluated the patient this evening and the plan is for a sigmoid colectomy, which is the definitive intervention.  No further intervention from GI at this time.

## 2020-11-01 NOTE — Progress Notes (Addendum)
Patient is discussing procedure with Dr. Deatra Canter and Dr. Levon Hedger decision made to transfer patient to [facility] for increased medical support for this patient. This decision was discussed with patient and husband. Report given to [facility] upon arrival back to room 336. Reconsidered procedure and patient needs by MD's involved plans to proceed with procedure.

## 2020-11-01 NOTE — Progress Notes (Signed)
Report called and given to Asher Muir RN at [facility] -[facility]. No further questions at this time from receiving nurse.

## 2020-11-01 NOTE — Progress Notes (Signed)
Received report from [place], Charity fundraiser at [facility].  Pt has fragile x syndrome. Will need general anesthesia for procedure. NO GASEOUS ANESTHETICS. CAN ONLY HAVE PROPOFOL.

## 2020-11-01 NOTE — Consult Note (Signed)
Consulting Physician: Hyman Hopes [place]ulte  Referring Provider: Shon Hale  Chief Complaint: Sigmoid volvulus  Reason for Consult: Sigmoid volvulus   Subjective   HPI: Meredith Walter is an 65 y.o. female who is here for sigmoid volvulus.    She underwent successful decompression of a sigmoid volvulus with gastroenterology [facility].  She was getting ready for sigmoid colectomy, however due to her fragile X syndrome she does not tolerate inhalation anesthetics.  The anesthesia team at [facility] was not comfortable putting her to sleep for a sigmoid colectomy, so she was transferred to [facility].  She is feeling well since her volvulus has been reduced.  Past Medical History:  Diagnosis Date  . Asthma   . 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  . Constipation     Past Surgical History:  Procedure Laterality Date  . ABDOMINAL HYSTERECTOMY     per patient, she had right ovary removed only.   . APPENDECTOMY      Family History  Problem Relation Age of Onset  . Colon cancer Mother        diagnosed in her 34s  . Colon cancer Father        diagnosed in his 32s    Social:  reports that she has never smoked. She has never used smokeless tobacco. She reports that she does not drink alcohol and does not use drugs.  Allergies:  Allergies  Allergen Reactions  . Dairycare [Lactase-Lactobacillus]     Medications: Current Outpatient Medications  Medication Instructions  . lubiprostone (AMITIZA) 8 mcg, Oral, 2 times daily  . Multiple Vitamin (MULTIVITAMIN) tablet 1 tablet, Oral, Daily  . omeprazole (PRILOSEC) 20 mg, Oral, Daily  . QVAR REDIHALER 80 MCG/ACT inhaler 1-2 puffs, Inhalation, 2 times daily  . risedronate (ACTONEL) 150 mg, Oral, Every 30 days  . vitamin B-12 (CYANOCOBALAMIN) 500 mcg, Oral, Daily  . VITAMIN D PO 1 tablet, Oral, Daily    ROS - all of the below systems have been reviewed with the  patient and positives are indicated with bold text General: chills, fever or night sweats Eyes: blurry vision or double vision ENT: epistaxis or sore throat Allergy/Immunology: itchy/watery eyes or nasal congestion Hematologic/Lymphatic: bleeding problems, blood clots or swollen lymph nodes Endocrine: temperature intolerance or unexpected weight changes Breast: new or changing breast lumps or nipple discharge Resp: cough, shortness of breath, or wheezing CV: chest pain or dyspnea on exertion GI: as per HPI GU: dysuria, trouble voiding, or hematuria MSK: joint pain or joint stiffness Neuro: TIA or stroke symptoms Derm: pruritus and skin lesion changes Psych: anxiety and depression  Objective   PE Blood pressure (!) 106/55, pulse 62, temperature 98.3 F (36.8 C), resp. rate 16, height 5\' 3"  (1.6 m), weight 45.4 kg, SpO2 100 %. Constitutional: NAD; conversant; no deformities Eyes: Moist conjunctiva; no lid lag; anicteric; PERRL Neck: Trachea midline; no thyromegaly Lungs: Normal respiratory effort; no tactile fremitus CV: RRR; no palpable thrills; no pitting edema GI: Abd soft, nontender; no palpable hepatosplenomegaly MSK: Normal range of motion of extremities; no clubbing/cyanosis Psychiatric: Appropriate affect; alert and oriented x3 Lymphatic: No palpable cervical or axillary lymphadenopathy  Results for orders placed or performed during the hospital encounter of 11/01/20 (from the past 24 hour(s))  CBC with Differential     Status: Abnormal   Collection Time: 11/01/20  9:54 AM  Result Value Ref Range   WBC 7.7 4.0 -  10.5 K/uL   RBC 4.20 3.87 - 5.11 MIL/uL   Hemoglobin 13.3 12.0 - 15.0 g/dL   HCT 89.3 73.4 - 28.7 %   MCV 101.2 (H) 80.0 - 100.0 fL   MCH 31.7 26.0 - 34.0 pg   MCHC 31.3 30.0 - 36.0 g/dL   RDW 68.1 15.7 - 26.2 %   Platelets 260 150 - 400 K/uL   nRBC 0.0 0.0 - 0.2 %   Neutrophils Relative % 84 %   Neutro Abs 6.5 1.7 - 7.7 K/uL   Lymphocytes Relative 10 %    Lymphs Abs 0.7 0.7 - 4.0 K/uL   Monocytes Relative 5 %   Monocytes Absolute 0.4 0.1 - 1.0 K/uL   Eosinophils Relative 0 %   Eosinophils Absolute 0.0 0.0 - 0.5 K/uL   Basophils Relative 1 %   Basophils Absolute 0.0 0.0 - 0.1 K/uL   Immature Granulocytes 0 %   Abs Immature Granulocytes 0.02 0.00 - 0.07 K/uL  Comprehensive metabolic panel     Status: Abnormal   Collection Time: 11/01/20  9:54 AM  Result Value Ref Range   Sodium 142 135 - 145 mmol/L   Potassium 3.8 3.5 - 5.1 mmol/L   Chloride 105 98 - 111 mmol/L   CO2 27 22 - 32 mmol/L   Glucose, Bld 109 (H) 70 - 99 mg/dL   BUN 12 8 - 23 mg/dL   Creatinine, Ser 0.35 0.44 - 1.00 mg/dL   Calcium 8.4 (L) 8.9 - 10.3 mg/dL   Total Protein 6.7 6.5 - 8.1 g/dL   Albumin 3.8 3.5 - 5.0 g/dL   AST 25 15 - 41 U/L   ALT 19 0 - 44 U/L   Alkaline Phosphatase 46 38 - 126 U/L   Total Bilirubin 0.6 0.3 - 1.2 mg/dL   GFR, Estimated >59 >74 mL/min   Anion gap 10 5 - 15  Lipase, blood     Status: None   Collection Time: 11/01/20  9:54 AM  Result Value Ref Range   Lipase 33 11 - 51 U/L  Resp Panel by RT-PCR (Flu A&B, Covid) Nasopharyngeal Swab     Status: None   Collection Time: 11/01/20 11:38 AM   Specimen: Nasopharyngeal Swab; Nasopharyngeal(NP) swabs in vial transport medium  Result Value Ref Range   SARS Coronavirus 2 by RT PCR NEGATIVE NEGATIVE   Influenza A by PCR NEGATIVE NEGATIVE   Influenza B by PCR NEGATIVE NEGATIVE     Imaging Orders     CT Abdomen Pelvis W Contrast     DG Abd 1 View   Assessment and Plan   Meredith Walter is an 65 y.o. female with sigmoid volvulus which was reduced at [facility].  She was transferred to [facility] for surgery due to her fragile X syndrome and inability to tolerate inhalation anesthetics.  I will work with my partner, Dr. Carolynne Edouard who is the doctor of the week this week to get her on the schedule for sigmoid colectomy.  We will discussed the case with anesthesia in the morning as she will  likely need total IV anesthesia.   Appreciate medical team's consult.   Quentin Ore, MD  [facility] Use [email or web address] to contact on call provider

## 2020-11-01 NOTE — ED Notes (Signed)
ED Provider at bedside. 

## 2020-11-01 NOTE — ED Provider Notes (Signed)
[facility] EMERGENCY DEPARTMENT Provider Note   CSN: [id number] Arrival date & time: 11/01/20  8299     History Chief Complaint  Patient presents with  . Abdominal Pain    Constipation    Meredith Walter is a 65 y.o. female who lives in [place] and is currently traveling to [place] in her motor home.  She has a history of chronic constipation and is followed by gastroenterology in [place].  Patient reports that she was having increased constipation a few weeks ago so her doctor told her to take magnesium citrate and increase her Linzess from 72 mg to 145 mg twice daily.  This caused her to have severe diarrhea so she was backed off of the Linzess down to 72 mg again.  This caused her to become constipated.  She was then switched to a medication called lubiprostone 8 mg twice daily.  Patient states that has been over a week since she has been able to make a bowel movement.  She has severe abdominal distention.  It has been 3 days since she has been able to pass gas.  She has a history of a previous appendectomy, no other abdominal surgeries and no previous history of bowel obstruction.  The patient took magnesium citrate last night with no relief in her symptoms.  She denies sensation of stool in her rectum or urgency to defecate.  She has moderate to severe abdominal pain, denies nausea, vomiting or urinary symptoms.  She denies fevers or chills.  Patient does not take narcotic medications.  HPI     Past Medical History:  Diagnosis Date  . Asthma   . 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  . Constipation     Patient Active Problem List   Diagnosis Date Noted  . Volvulus of sigmoid colon (HCC) 11/01/2020    Class: Acute  . 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 Chronic constipation in female 11/01/2020  . Asthma  11/01/2020  . Sigmoid volvulus [facility])     Past Surgical History:  Procedure Laterality Date  . ABDOMINAL HYSTERECTOMY     per patient, she had right ovary removed only.   . APPENDECTOMY       OB History   No obstetric history on file.     Family History  Problem Relation Age of Onset  . Colon cancer Mother        diagnosed in her 31s  . Colon cancer Father        diagnosed in his 8s    Social History   Tobacco Use  . Smoking status: Never Smoker  . Smokeless tobacco: Never Used  Vaping Use  . Vaping Use: Never used  Substance Use Topics  . Alcohol use: Never  . Drug use: Never    Home Medications Prior to Admission medications   Medication Sig Start Date End Date Taking? Authorizing Provider  lubiprostone (AMITIZA) 8 MCG capsule Take 8 mcg by mouth 2 (two) times daily. 10/27/20  Yes [provider]  Multiple Vitamin (MULTIVITAMIN) tablet Take 1 tablet by mouth daily.   Yes [provider]  omeprazole (PRILOSEC) 20 MG capsule Take 20 mg by mouth daily. 10/18/20  Yes [provider]  QVAR REDIHALER 80 MCG/ACT inhaler Inhale 1-2 puffs into the lungs in the morning and at bedtime.  09/02/20  Yes [provider]  risedronate (ACTONEL) 150 MG tablet Take 150 mg by mouth every 30 (thirty) days. 09/20/20  Yes [provider]  vitamin B-12 (CYANOCOBALAMIN) 500 MCG tablet Take 500 mcg by mouth daily.   Yes [provider]  VITAMIN D PO Take 1 tablet by mouth daily.   Yes [provider]    Allergies    Dairycare [lactase-lactobacillus]  Review of Systems   Review of Systems Ten systems reviewed and are negative for acute change, except as noted in the HPI.   Physical Exam Updated Vital Signs BP 126/66   Pulse 81   Temp 98.1 F (36.7 C) (Oral)   Resp 17   Ht 5\' 3"  (1.6 m)   Wt 45.4 kg   SpO2 99%   BMI 17.71 kg/m   Physical Exam Vitals and nursing note reviewed.  Constitutional:      General: She is not  in acute distress.    Appearance: She is well-developed. She is not diaphoretic.  HENT:     Head: Normocephalic and atraumatic.  Eyes:     General: No scleral icterus.    Conjunctiva/sclera: Conjunctivae normal.  Cardiovascular:     Rate and Rhythm: Normal rate and regular rhythm.     Heart sounds: Normal heart sounds. No murmur heard. No friction rub. No gallop.   Pulmonary:     Effort: Pulmonary effort is normal. No respiratory distress.     Breath sounds: Normal breath sounds.  Abdominal:     General: Bowel sounds are decreased. There is distension.     Palpations: There is no fluid wave or mass.     Tenderness: There is generalized abdominal tenderness. There is no guarding.  Genitourinary:    Comments: Digital Rectal Exam reveals sphincter with good tone. Non-thrombosed external hemorrhoids. No masses or fissures. No stool in the rectum. Musculoskeletal:     Cervical back: Normal range of motion.  Skin:    General: Skin is warm and dry.  Neurological:     Mental Status: She is alert and oriented to person, place, and time.  Psychiatric:        Behavior: Behavior normal.     ED Results / Procedures / Treatments   Labs (all labs ordered are listed, but only abnormal results are displayed) Labs Reviewed  CBC WITH DIFFERENTIAL/PLATELET - Abnormal; Notable for the following components:      Result Value   MCV 101.2 (*)    All other components within normal limits  COMPREHENSIVE METABOLIC PANEL - Abnormal; Notable for the following components:   Glucose, Bld 109 (*)    Calcium 8.4 (*)    All other components within normal limits  RESP PANEL BY RT-PCR (FLU A&B, COVID) ARPGX2  LIPASE, BLOOD  HIV ANTIBODY (ROUTINE TESTING W REFLEX)  BASIC METABOLIC PANEL  CBC    EKG None  Radiology DG Abd 1 View  Result Date: 11/01/2020 CLINICAL DATA:  Colonic volvulus post flexible sigmoidoscopy EXAM: ABDOMEN - 1 VIEW COMPARISON:  Portable exam 1733 hours compared to CT abdomen  and pelvis FINDINGS: Stool throughout colon. Contrast within urinary bladder. Colonic distention seen on prior CT exam no longer identified. Small bowel gas pattern normal. No definite bowel wall thickening. Mild osseous demineralization. IMPRESSION: Interval reduction of sigmoid volvulus and decompression of the colon. Electronically Signed   By: 11/03/2020 M.D.   On: 11/01/2020 17:55   CT Abdomen Pelvis W Contrast  Result Date: 11/01/2020 CLINICAL DATA:  Abdominal distension. EXAM: CT ABDOMEN AND PELVIS WITH CONTRAST TECHNIQUE: Multidetector CT imaging of the abdomen and pelvis was performed using the standard protocol following bolus administration of intravenous contrast. CONTRAST:  100mL OMNIPAQUE IOHEXOL 300 MG/ML  SOLN COMPARISON:  None. FINDINGS: Lower chest: The lung bases are clear of an acute process. No pleural effusions or pulmonary lesions. The heart is normal in size. No pericardial effusion. There is a moderate-sized hiatal hernia noted. Hepatobiliary: No hepatic lesions or intrahepatic biliary dilatation. The gallbladder appears normal. No common bile duct dilatation. Pancreas: No mass, inflammation or ductal dilatation. Spleen: Normal size.  No focal lesions. Adrenals/Urinary Tract: The adrenal glands and kidneys are unremarkable. The bladder is unremarkable. Stomach/Bowel: The stomach is relatively decompressed. The small bowel is unremarkable. No distension or obstruction. Markedly distended colon measuring up to 8 cm in some areas. There is moderate stool and some fluid scattered in the colon. Low lying cecum deep in the pelvis and displacing the rectum to the left. The mid sigmoid colon and rectum are decompressed. There is a definite transition zone at the iliac crest midline with swirling mesenteric vessels and twisting of the sigmoid colon consistent with sigmoid volvulus. Vascular/Lymphatic: The aorta and branch vessels are patent. The major venous structures are patent. No mesenteric  or retroperitoneal mass or adenopathy. Reproductive: The uterus and ovaries are grossly normal. A small calcified fibroid is noted. Other: No free air or free fluid is identified. Musculoskeletal: No significant bony findings. Degenerative anterolisthesis of L4 and L5 and associated advanced degenerative disc disease at L4-5 and L5-S1. IMPRESSION: 1. CT findings consistent with a sigmoid volvulus. Markedly distended colon above the volvulus but no findings for perforation or pneumatosis. Recommend surgical consultation. 2. Moderate-sized hiatal hernia. Electronically Signed   By: Rudie MeyerP.  Gallerani M.D.   On: 11/01/2020 11:12    Procedures .Critical Care Performed by: Arthor CaptainHarris, Abigail, PA-C Authorized by: Arthor CaptainHarris, Abigail, PA-C   Critical care provider statement:    Critical care time (minutes):  60   Critical care time was exclusive of:  Separately billable procedures and treating other patients   Critical care was necessary to treat or prevent imminent or life-threatening deterioration of the following conditions: Acute sigmoid volvuus and bowel obstruction.   Critical care was time spent personally by me on the following activities:  Discussions with consultants, evaluation of patient's response to treatment, examination of patient, ordering and performing treatments and interventions, ordering and review of laboratory studies, ordering and review of radiographic studies, pulse oximetry, re-evaluation of patient's condition, obtaining history from patient or surrogate and review of old charts     Medications Ordered in ED Medications  dextrose 5 %-0.45 % sodium chloride infusion (0 mLs Intravenous Stopped 11/01/20 1428)  HYDROmorphone (DILAUDID) injection 1 mg ( Intravenous MAR Unhold 11/01/20 1813)  simethicone (MYLICON) 40 MG/0.6ML suspension (has no administration in time range)  vitamin B-12 (CYANOCOBALAMIN) tablet 500 mcg ( Oral MAR Unhold 11/01/20 1813)  multivitamin with minerals tablet 1  tablet ( Oral MAR Unhold 11/01/20 1813)  cholecalciferol (VITAMIN D3) tablet 400 Units ( Oral MAR Unhold 11/01/20 1813)  sodium chloride flush (NS) 0.9 % injection 3 mL ( Intravenous MAR Unhold 11/01/20 1813)  sodium chloride flush (NS) 0.9 % injection 3 mL ( Intravenous MAR Unhold 11/01/20 1813)  sodium chloride flush (NS) 0.9 % injection 3 mL ( Intravenous MAR Unhold 11/01/20 1813)  0.9 %  sodium chloride infusion ( Intravenous MAR Unhold 11/01/20 1813)  acetaminophen (TYLENOL) tablet 650 mg (  Oral MAR Unhold 11/01/20 1813)    Or  acetaminophen (TYLENOL) suppository 650 mg ( Rectal MAR Unhold 11/01/20 1813)  traZODone (DESYREL) tablet 50 mg ( Oral MAR Unhold 11/01/20 1813)  polyethylene glycol (MIRALAX / GLYCOLAX) packet 17 g ( Oral MAR Unhold 11/01/20 1813)  bisacodyl (DULCOLAX) suppository 10 mg ( Rectal MAR Unhold 11/01/20 1813)  ondansetron (ZOFRAN) tablet 4 mg ( Oral MAR Unhold 11/01/20 1813)    Or  ondansetron (ZOFRAN) injection 4 mg ( Intravenous MAR Unhold 11/01/20 1813)  albuterol (PROVENTIL) (2.5 MG/3ML) 0.083% nebulizer solution 2.5 mg ( Nebulization MAR Unhold 11/01/20 1813)  metoCLOPramide (REGLAN) injection 5 mg (5 mg Intravenous Given 11/01/20 0949)  0.9 %  sodium chloride infusion (0 mLs Intravenous Stopped 11/01/20 1057)  dicyclomine (BENTYL) capsule 10 mg (10 mg Oral Given 11/01/20 0951)  iohexol (OMNIPAQUE) 300 MG/ML solution 100 mL (100 mLs Intravenous Contrast Given 11/01/20 1045)    ED Course  I have reviewed the triage vital signs and the nursing notes.  Pertinent labs & imaging results that were available during my care of the patient were reviewed by me and considered in my medical decision making (see chart for details).  Clinical Course as of 11/01/20 1854  Tue Nov 01, 2020  1201 Dr. Levon Hedger and Dr. Henreitta Leber [AH]    Clinical Course User Index [AH] Arthor Captain, PA-C   MDM Rules/Calculators/A&P                          [id number] pain VS: BP 126/66   Pulse 81    Temp 98.1 F (36.7 C) (Oral)   Resp 17   Ht 5\' 3"  (1.6 m)   Wt 45.4 kg   SpO2 99%   BMI 17.71 kg/m   is gathered by patient and husband. Previous records obtained and reviewed. DDX:The patient's complaint of abdominal pain and distension involves an extensive number of diagnostic and treatment options, and is a complaint that carries with it a high risk of complications, morbidity, and potential mortality. Given the large differential diagnosis, medical decision making is of high complexity. differential diagnosis  Labs: I ordered reviewed and interpreted labs which include CBC which shows elevated MCV without other significant abnormality, CMP with mildly elevated glucose, lipase within normal limits, Covid panel negative. Imaging: I ordered and reviewed images which included CT abdomen pelvis. I independently visualized and interpreted all imaging. Significant findings include acute sigmoid volvulus.  EKG: Consults: Case discussed with Dr. SE:[id number] who will take the patient for flex sigmoidoscopy as well as with Dr. Levon Hedger who will consult on the patient.  Dr. Larae Grooms  will admit the patient MDM: 65 year old female here with complaint of severe abdominal pain and distention, no bowel movement or passing gas for the past 3 days with acute sigmoid volvulus.  I discussed all findings with the patient he will be admitted for reduction of the volvulus.  She was stable throughout her emergency department visit with pain controlled here. Patient disposition:The patient appears reasonably stabilized for admission considering the current resources, flow, and capabilities available in the ED at this time, and I doubt any other [facility] requiring further screening and/or treatment in the ED prior to admission.        Final Clinical Impression(s) / ED Diagnoses Final diagnoses:  Sigmoid volvulus [facility])    Rx / DC Orders ED Discharge Orders    None       [facility],  PA-C 11/01/20  1901    Vanetta Mulders, MD 11/08/20 1007

## 2020-11-01 NOTE — Op Note (Signed)
[facility] Patient Name: Meredith Walter Procedure Date: 11/01/2020 4:18 PM MRN: [id number] Date of Birth: Aug 06, 1956 Attending MD: Katrinka Blazing ,  CSN: [id number] Age: 65 Admit Type: Inpatient Procedure:                Flexible Sigmoidoscopy Indications:              For therapy of volvulus Providers:                Katrinka Blazing, Brain Hilts, RN, Darci Needle, Technician Referring MD:              Medicines:                None Complications:            No immediate complications. Estimated Blood Loss:     Estimated blood loss: none. Procedure:                Pre-Anesthesia Assessment:                           - Prior to the procedure, a History and Physical                            was performed, and patient medications, allergies                            and sensitivities were reviewed. The patient's                            tolerance of previous anesthesia was reviewed.                           - The risks and benefits of the procedure and the                            sedation options and risks were discussed with the                            patient. All questions were answered and informed                            consent was obtained.                           - ASA Grade Assessment: II - A patient with mild                            systemic disease.                           After obtaining informed consent, the scope was                            passed under direct vision. The [id number]-[id number]                            (  [id number]) scope was introduced through the anus and                            advanced to the 60 cm from the anal verge. The                            flexible sigmoidoscopy was somewhat difficult due                            to poor bowel prep. The quality of the bowel                            preparation was poor. Scope In: 4:49:14 PM Scope Out: 5:12:29 PM Total Procedure Duration: 0 hours  23 minutes 15 seconds  Findings:      The perianal and digital rectal examinations were normal.      A volvulus with viable appearing mucosa was found from sigmoid to       descending colon. Decompression of the volvulus was attempted and was       successful, with complete decompression achieved. I reduced multiple       timesthe torsioned looped, endoscopic reduction was achieved. Patient       reported resolution of the abdominal pain and distention with procedure. Impression:               - Preparation of the colon was poor.                           - Volvulus. Successful complete decompression                            achieved.                           - No specimens collected. Moderate Sedation:      unsedated Recommendation:           - Return patient to hospital ward for ongoing care.                           - Surgical correction of volvulus. Procedure Code(s):        --- Professional ---                           [phone], Sigmoidoscopy, flexible; with decompression                            (for pathologic distention) (eg, volvulus,                            megacolon), including placement of decompression                            tube, when performed Diagnosis Code(s):        --- Professional ---                           K56.2, Volvulus  CPT copyright 2019 [employer]. All rights reserved. The codes documented in this report are preliminary and upon coder review may  be revised to meet current compliance requirements. Katrinka Blazing, MD Katrinka Blazing,  11/01/2020 5:27:29 PM This report has been signed electronically. Number of Addenda: 0

## 2020-11-01 NOTE — Progress Notes (Signed)
Pt arrived to floor from [facility] hosp. Pt alert and oriented x4. Denies c/o pain. Oriented to room and call bell. Per care order paged Dr. Elnoria Howard that pt arrived to unit. Dr. Elnoria Howard called back and aware pt is here. Also paged on call Dr. Leafy Half notified per report that pt needed to see gen surgery ASAP for pt to have bowel prep tonight for possible surgery in morning.

## 2020-11-02 ENCOUNTER — Encounter (HOSPITAL_COMMUNITY): Payer: Self-pay | Admitting: Gastroenterology

## 2020-11-02 DIAGNOSIS — K562 Volvulus: Principal | ICD-10-CM

## 2020-11-02 DIAGNOSIS — Q992 Fragile X chromosome: Secondary | ICD-10-CM

## 2020-11-02 LAB — BASIC METABOLIC PANEL
Anion gap: 6 (ref 5–15)
BUN: 6 mg/dL — ABNORMAL LOW (ref 8–23)
CO2: 23 mmol/L (ref 22–32)
Calcium: 8 mg/dL — ABNORMAL LOW (ref 8.9–10.3)
Chloride: 109 mmol/L (ref 98–111)
Creatinine, Ser: 0.67 mg/dL (ref 0.44–1.00)
GFR, Estimated: >60 mL/min (ref >60)
Glucose, Bld: 121 mg/dL — ABNORMAL HIGH (ref 70–99)
Potassium: 4.2 mmol/L (ref 3.5–5.1)
Sodium: 138 mmol/L (ref 135–145)

## 2020-11-02 LAB — CBC
HCT: 37.8 % (ref 36.0–46.0)
Hemoglobin: 12.5 g/dL (ref 12.0–15.0)
MCH: 32.6 pg (ref 26.0–34.0)
MCHC: 33.1 g/dL (ref 30.0–36.0)
MCV: 98.7 fL (ref 80.0–100.0)
Platelets: 241 10*3/uL (ref 150–400)
RBC: 3.83 MIL/uL — ABNORMAL LOW (ref 3.87–5.11)
RDW: 12.2 % (ref 11.5–15.5)
WBC: 6.4 10*3/uL (ref 4.0–10.5)
nRBC: 0 % (ref 0.0–0.2)

## 2020-11-02 LAB — GLUCOSE, CAPILLARY
Glucose-Capillary: 125 mg/dL — ABNORMAL HIGH (ref 70–99)
Glucose-Capillary: 128 mg/dL — ABNORMAL HIGH (ref 70–99)
Glucose-Capillary: 107 mg/dL — ABNORMAL HIGH (ref 70–99)
Glucose-Capillary: 99 mg/dL (ref 70–99)

## 2020-11-02 LAB — SURGICAL PCR SCREEN
MRSA, PCR: NEGATIVE
Staphylococcus aureus: NEGATIVE

## 2020-11-02 MED ORDER — ENOXAPARIN SODIUM 40 MG/0.4ML ~~LOC~~ SOLN: 40.0000 mg | SUBCUTANEOUS | Status: DC

## 2020-11-02 MED ORDER — ALVIMOPAN 12 MG PO CAPS
12.0000 mg | ORAL_CAPSULE | ORAL | Status: AC
  Administered 2020-11-03: 12 mg via ORAL
  Filled 2020-11-02: qty 1

## 2020-11-02 MED ORDER — CEFAZOLIN SODIUM-DEXTROSE 1-4 GM/50ML-% IV SOLN: 1.0000 g | INTRAVENOUS | Status: DC

## 2020-11-02 MED ORDER — SODIUM CHLORIDE 0.9 % IV SOLN
2.0000 g | INTRAVENOUS | Status: AC
  Administered 2020-11-03: 2 g via INTRAVENOUS
  Filled 2020-11-02 (×2): qty 2

## 2020-11-02 MED ORDER — CHLORHEXIDINE GLUCONATE CLOTH 2 % EX PADS
6.0000 | MEDICATED_PAD | Freq: Once | CUTANEOUS | Status: AC
  Administered 2020-11-02: 6 via TOPICAL

## 2020-11-02 NOTE — Progress Notes (Signed)
1 Day Post-Op  Subjective: CC: Sigmoid volvulus  [facility] successful decompression of sigmoid volvulus by gastroenterology.  Patient has a history of fragile X syndrome and does not tolerate inhalation anesthetics.  The anesthesia team at [facility] was not comfortable putting her to sleep for a sigmoid colectomy, so she was transferred to [facility].  She reports since decompression by GI she has had no abdominal pain, abdominal distention, nausea or emesis.  She reports a formed BM this morning.  She reports no oral intake since she presented to the ED on 3/15 p.m.  She is not on blood thinners.  She has a history of a prior appendectomy and oophorectomy.   Patient [place]  She and her husband were traveling in their motorhome down to [place] to visit her ill brother when they made a pit stop and [place] and had onset of her symptoms.  Patient reports that she recently lost her father in November of last year after he had a colectomy for colon cancer and leaked.  Objective: Vital signs in last 24 hours: Temp:  [97.2 F (36.2 C)-98.6 F (37 C)] 97.9 F (36.6 C) (03/16 0600) Pulse Rate:  [60-81] 76 (03/16 0600) Resp:  [14-20] 17 (03/16 0600) BP: (106-170)/(51-151) 113/57 (03/16 0600) SpO2:  [94 %-100 %] 96 % (03/16 0600) Weight:  [45.4 kg-48.4 kg] 48.4 kg (03/16 0500) Last BM Date: 11/02/20  Intake/Output from previous day: 03/15 0701 - 03/16 0700 In: 2625.1 [I.V.:2625.1] Out: -  Intake/Output this shift: No intake/output data recorded.  PE: General: pleasant, WD, female who is laying in bed in NAD HEENT: head is normocephalic, atraumatic.  Sclera are noninjected.  PERRL.  Ears and nose without any masses or lesions.  Mouth is pink and moist Heart: regular, rate, and rhythm. Palpable radial and pedal pulses bilaterally Lungs: CTA b/l,  Respiratory effort nonlabored with normal rate Abd: Soft, NT, ND,  +BS, no masses, hernias, or organomegaly, prior abdominal scars well healed MS: MAE's. All 4 extremities are symmetrical with no cyanosis, clubbing, or edema. Skin: warm and dry with no masses, lesions, or rashes Neuro: Cranial nerves 2-12 grossly intact, sensation is normal throughout Psych: A&Ox3 with an appropriate affect.   Lab Results:  Recent Labs    11/01/20 0954 11/02/20 0118  WBC 7.7 6.4  HGB 13.3 12.5  HCT 42.5 37.8  PLT 260 241   BMET Recent Labs    11/01/20 0954 11/02/20 0118  NA 142 138  K 3.8 4.2  CL 105 109  CO2 27 23  GLUCOSE 109* 121*  BUN 12 6*  CREATININE 0.64 0.67  CALCIUM 8.4* 8.0*   PT/INR No results for input(s): LABPROT, INR in the last 72 hours. CMP     Component Value Date/Time   NA 138 11/02/2020 0118   K 4.2 11/02/2020 0118   CL 109 11/02/2020 0118   CO2 23 11/02/2020 0118   GLUCOSE 121 (H) 11/02/2020 0118   BUN 6 (L) 11/02/2020 0118   CREATININE 0.67 11/02/2020 0118   CALCIUM 8.0 (L) 11/02/2020 0118   PROT 6.7 11/01/2020 0954   ALBUMIN 3.8 11/01/2020 0954   AST 25 11/01/2020 0954   ALT 19 11/01/2020 0954   ALKPHOS 46 11/01/2020 0954   BILITOT 0.6 11/01/2020 0954   GFRNONAA >60 11/02/2020 0118   Lipase     Component Value Date/Time   LIPASE 33 11/01/2020 0954  Studies/Results: DG Abd 1 View  Result Date: 11/01/2020 CLINICAL DATA:  Colonic volvulus post flexible sigmoidoscopy EXAM: ABDOMEN - 1 VIEW COMPARISON:  Portable exam 1733 hours compared to CT abdomen and pelvis FINDINGS: Stool throughout colon. Contrast within urinary bladder. Colonic distention seen on prior CT exam no longer identified. Small bowel gas pattern normal. No definite bowel wall thickening. Mild osseous demineralization. IMPRESSION: Interval reduction of sigmoid volvulus and decompression of the colon. Electronically Signed   By: Ulyses Southward M.D.   On: 11/01/2020 17:55   CT Abdomen Pelvis W Contrast  Result Date: 11/01/2020 CLINICAL DATA:   Abdominal distension. EXAM: CT ABDOMEN AND PELVIS WITH CONTRAST TECHNIQUE: Multidetector CT imaging of the abdomen and pelvis was performed using the standard protocol following bolus administration of intravenous contrast. CONTRAST:  OMNIPAQUE IOHEXOL 300 MG/ML  SOLN COMPARISON:  None. FINDINGS: Lower chest: The lung bases are clear of an acute process. No pleural effusions or pulmonary lesions. The heart is normal in size. No pericardial effusion. There is a moderate-sized hiatal hernia noted. Hepatobiliary: No hepatic lesions or intrahepatic biliary dilatation. The gallbladder appears normal. No common bile duct dilatation. Pancreas: No mass, inflammation or ductal dilatation. Spleen: Normal size.  No focal lesions. Adrenals/Urinary Tract: The adrenal glands and kidneys are unremarkable. The bladder is unremarkable. Stomach/Bowel: The stomach is relatively decompressed. The small bowel is unremarkable. No distension or obstruction. Markedly distended colon measuring up to 8 cm in some areas. There is moderate stool and some fluid scattered in the colon. Low lying cecum deep in the pelvis and displacing the rectum to the left. The mid sigmoid colon and rectum are decompressed. There is a definite transition zone at the iliac crest midline with swirling mesenteric vessels and twisting of the sigmoid colon consistent with sigmoid volvulus. Vascular/Lymphatic: The aorta and branch vessels are patent. The major venous structures are patent. No mesenteric or retroperitoneal mass or adenopathy. Reproductive: The uterus and ovaries are grossly normal. A small calcified fibroid is noted. Other: No free air or free fluid is identified. Musculoskeletal: No significant bony findings. Degenerative anterolisthesis of L4 and L5 and associated advanced degenerative disc disease at L4-5 and L5-S1. IMPRESSION: 1. CT findings consistent with a sigmoid volvulus. Markedly distended colon above the volvulus but no findings for  perforation or pneumatosis. Recommend surgical consultation. 2. Moderate-sized hiatal hernia. Electronically Signed   By: Rudie Meyer M.D.   On: 11/01/2020 11:12    Anti-infectives: Anti-infectives (From admission, onward)   None       Assessment/Plan Fragile X syndrome  Sigmoid Volvulus  - S/p Flexible Sigmoidoscopy and successful decompression on 11/01/20 by GI - Currently patient's abdomen is flat, nondistended, and nontender on exam.  She is having bowel function. - Will plan for laparoscopic assisted sigmoid colectomy tomorrow pending availability in the OR and discussions with anesthesia.  The anatomy and physiology of the GI tract was discussed with the patient. The planned procedure, material risks (including, but not limited to anesthesia (complications of her fragile x syndrome, Mi, CVA, death), pain, bleeding, infection, damage to surrounding structures [blood vessels/nerves/viscus/organs/ureter], leak from anastomosis, need for stoma/colostomy, possible conversion to open procedure were discussed with the patient. Additionally, we discussed typical postoperative expectations and the recovery process. The patient's questions were answered to their satisfaction, they voiced understanding and elected to proceed with surgery.  - Allow CLD today. NPO after midnight.   FEN - CLD, NPO at midnight, IVF per [facility] VTE - SCDs,  ID -  None currently.    LOS: 1 day    Kessie Croston M Abdikadir Fohl , PA-C [facility] 11/02/2020, 8:46 AM Please see Amion for pager number during day hours 7:00am-4:30pm 

## 2020-11-02 NOTE — Plan of Care (Signed)
  Problem: Education: Goal: Knowledge of General Education information will improve Description Including pain rating scale, medication(s)/side effects and non-pharmacologic comfort measures Outcome: Progressing   Problem: Health Behavior/Discharge Planning: Goal: Ability to manage health-related needs will improve Outcome: Progressing   

## 2020-11-02 NOTE — H&P (View-Only) (Signed)
1 Day Post-Op  Subjective: CC: Sigmoid volvulus  [facility] after successful decompression of sigmoid volvulus by gastroenterology.  Patient has a history of fragile X syndrome and does not tolerate inhalation anesthetics.  The anesthesia team at [facility] was not comfortable putting her to sleep for a sigmoid colectomy, so she was transferred to [facility].  She reports since decompression by GI she has had no abdominal pain, abdominal distention, nausea or emesis.  She reports a formed BM this morning.  She reports no oral intake since she presented to the ED on 3/15 p.m.  She is not on blood thinners.  She has a history of a prior appendectomy and oophorectomy.   Patient reports that [place].  She and her husband were traveling in their motorhome down to [place] to visit her ill brother when they made a pit stop and [place] and had onset of her symptoms.  Patient reports that she recently lost her father in November of last year after he had a colectomy for colon cancer and leaked.  Objective: Vital signs in last 24 hours: Temp:  [97.2 F (36.2 C)-98.6 F (37 C)] 97.9 F (36.6 C) (03/16 0600) Pulse Rate:  [60-81] 76 (03/16 0600) Resp:  [14-20] 17 (03/16 0600) BP: (106-170)/(51-151) 113/57 (03/16 0600) SpO2:  [94 %-100 %] 96 % (03/16 0600) Weight:  [45.4 kg-48.4 kg] 48.4 kg (03/16 0500) Last BM Date: 11/02/20  Intake/Output from previous day: 03/15 0701 - 03/16 0700 In: 2625.1 [I.V.:2625.1] Out: -  Intake/Output this shift: No intake/output data recorded.  PE: General: pleasant, WD, female who is laying in bed in NAD HEENT: head is normocephalic, atraumatic.  Sclera are noninjected.  PERRL.  Ears and nose without any masses or lesions.  Mouth is pink and moist Heart: regular, rate, and rhythm. Palpable radial and pedal pulses bilaterally Lungs: CTA b/l,  Respiratory effort nonlabored with normal rate Abd: Soft, NT, ND,  +BS, no masses, hernias, or organomegaly, prior abdominal scars well healed MS: MAE's. All 4 extremities are symmetrical with no cyanosis, clubbing, or edema. Skin: warm and dry with no masses, lesions, or rashes Neuro: Cranial nerves 2-12 grossly intact, sensation is normal throughout Psych: A&Ox3 with an appropriate affect.   Lab Results:  Recent Labs    11/01/20 0954 11/02/20 0118  WBC 7.7 6.4  HGB 13.3 12.5  HCT 42.5 37.8  PLT 260 241   BMET Recent Labs    11/01/20 0954 11/02/20 0118  NA 142 138  K 3.8 4.2  CL 105 109  CO2 27 23  GLUCOSE 109* 121*  BUN 12 6*  CREATININE 0.64 0.67  CALCIUM 8.4* 8.0*   PT/INR No results for input(s): LABPROT, INR in the last 72 hours. CMP     Component Value Date/Time   NA 138 11/02/2020 0118   K 4.2 11/02/2020 0118   CL 109 11/02/2020 0118   CO2 23 11/02/2020 0118   GLUCOSE 121 (H) 11/02/2020 0118   BUN 6 (L) 11/02/2020 0118   CREATININE 0.67 11/02/2020 0118   CALCIUM 8.0 (L) 11/02/2020 0118   PROT 6.7 11/01/2020 0954   ALBUMIN 3.8 11/01/2020 0954   AST 25 11/01/2020 0954   ALT 19 11/01/2020 0954   ALKPHOS 46 11/01/2020 0954   BILITOT 0.6 11/01/2020 0954   GFRNONAA >60 11/02/2020 0118   Lipase     Component Value Date/Time   LIPASE 33 11/01/2020 0954  Studies/Results: DG Abd 1 View  Result Date: 11/01/2020 CLINICAL DATA:  Colonic volvulus post flexible sigmoidoscopy EXAM: ABDOMEN - 1 VIEW COMPARISON:  Portable exam 1733 hours compared to CT abdomen and pelvis FINDINGS: Stool throughout colon. Contrast within urinary bladder. Colonic distention seen on prior CT exam no longer identified. Small bowel gas pattern normal. No definite bowel wall thickening. Mild osseous demineralization. IMPRESSION: Interval reduction of sigmoid volvulus and decompression of the colon. Electronically Signed   By: Ulyses Southward M.D.   On: 11/01/2020 17:55   CT Abdomen Pelvis W Contrast  Result Date: 11/01/2020 CLINICAL DATA:   Abdominal distension. EXAM: CT ABDOMEN AND PELVIS WITH CONTRAST TECHNIQUE: Multidetector CT imaging of the abdomen and pelvis was performed using the standard protocol following bolus administration of intravenous contrast. CONTRAST:  OMNIPAQUE IOHEXOL 300 MG/ML  SOLN COMPARISON:  None. FINDINGS: Lower chest: The lung bases are clear of an acute process. No pleural effusions or pulmonary lesions. The heart is normal in size. No pericardial effusion. There is a moderate-sized hiatal hernia noted. Hepatobiliary: No hepatic lesions or intrahepatic biliary dilatation. The gallbladder appears normal. No common bile duct dilatation. Pancreas: No mass, inflammation or ductal dilatation. Spleen: Normal size.  No focal lesions. Adrenals/Urinary Tract: The adrenal glands and kidneys are unremarkable. The bladder is unremarkable. Stomach/Bowel: The stomach is relatively decompressed. The small bowel is unremarkable. No distension or obstruction. Markedly distended colon measuring up to 8 cm in some areas. There is moderate stool and some fluid scattered in the colon. Low lying cecum deep in the pelvis and displacing the rectum to the left. The mid sigmoid colon and rectum are decompressed. There is a definite transition zone at the iliac crest midline with swirling mesenteric vessels and twisting of the sigmoid colon consistent with sigmoid volvulus. Vascular/Lymphatic: The aorta and branch vessels are patent. The major venous structures are patent. No mesenteric or retroperitoneal mass or adenopathy. Reproductive: The uterus and ovaries are grossly normal. A small calcified fibroid is noted. Other: No free air or free fluid is identified. Musculoskeletal: No significant bony findings. Degenerative anterolisthesis of L4 and L5 and associated advanced degenerative disc disease at L4-5 and L5-S1. IMPRESSION: 1. CT findings consistent with a sigmoid volvulus. Markedly distended colon above the volvulus but no findings for  perforation or pneumatosis. Recommend surgical consultation. 2. Moderate-sized hiatal hernia. Electronically Signed   By: Rudie Meyer M.D.   On: 11/01/2020 11:12    Anti-infectives: Anti-infectives (From admission, onward)   None       Assessment/Plan Fragile X syndrome  Sigmoid Volvulus  - S/p Flexible Sigmoidoscopy and successful decompression on 11/01/20 by GI - Currently patient's abdomen is flat, nondistended, and nontender on exam.  She is having bowel function. - Will plan for laparoscopic assisted sigmoid colectomy tomorrow pending availability in the OR and discussions with anesthesia.  The anatomy and physiology of the GI tract was discussed with the patient. The planned procedure, material risks (including, but not limited to anesthesia (complications of her fragile x syndrome, Mi, CVA, death), pain, bleeding, infection, damage to surrounding structures [blood vessels/nerves/viscus/organs/ureter], leak from anastomosis, need for stoma/colostomy, possible conversion to open procedure were discussed with the patient. Additionally, we discussed typical postoperative expectations and the recovery process. The patient's questions were answered to their satisfaction, they voiced understanding and elected to proceed with surgery.  - Allow CLD today. NPO after midnight.   FEN - CLD, NPO at midnight, IVF per [facility] VTE - SCDs,  ID -  None currently.    LOS: 1 day    Jacinto Halim , [facility] 11/02/2020, 8:46 AM Please see Amion for pager number during day hours 7:00am-4:30pm

## 2020-11-02 NOTE — Progress Notes (Signed)
PROGRESS NOTE  Meredith Walter  UEA:[id number] DOB: 1956/01/25 DOA: 11/01/2020 PCP: Oneita Hurt, No   Brief Narrative: Meredith Walter is a 65 y.o. female with a history of fragile X syndrome who was traveling to [place] from her home in [place] by motor home with her husband when she developed abdominal pain and distention in [place], [place] prompting evaluation in the ED. She was found to have sigmoid volvulus which was reduced endoscopically. Plan for sigmoid resection 3/17 with understanding that inhaled anesthetics stand a chance to care tremor/ataxia.   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  - Surgery planning sigmoid colectomy, laparoscopic vs. open w/ possible colostomy.   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  - Avoid inhalational anesthetics per anesthesia.   Asthma: Well-controlled  DVT prophylaxis: Lovenox Code Status: Full Family Communication: None at bedside Disposition Plan:  Status is: Inpatient  Remains inpatient appropriate because:Inpatient level of care appropriate due to severity of illness   Dispo: The patient is from: Home              Anticipated d/c is to: Home              Patient currently is not medically stable to d/c.  Consultants:   General surgery  Anesthesia  GI  Procedures:   Sigmoidoscopy  Antimicrobials:  Cefotetan per surgery   Subjective: Abdominal pain improved, no nausea or vomiting. Tolerating clears.   Objective: Vitals:   11/02/20 0200 11/02/20 0500 11/02/20 0600 11/02/20 1223  BP: (!) 110/51  (!) 113/57 (!) 113/52  Pulse: 62  76 63  Resp: 17  17 18   Temp: 98.3 F  (36.8 C)  97.9 F (36.6 C) 98.3 F (36.8 C)  TempSrc: Oral  Oral Oral  SpO2: 94%  96% 98%  Weight:  48.4 kg    Height:        Intake/Output Summary (Last 24 hours) at 11/02/2020 2121 Last data filed at 11/02/2020 1800 Gross per 24 hour  Intake 3092.99 ml  Output --  Net 3092.99 ml   Filed Weights   11/01/20 0854 11/02/20 0500  Weight: 45.4 kg 48.4 kg    Gen: 65 y.o. female in no distress  Pulm: Non-labored breathing. Clear to auscultation bilaterally.  CV: Regular rate and rhythm. No murmur, rub, or gallop. No JVD, no pedal edema. GI: Abdomen soft, not significantly tender, non-distended, with normoactive bowel sounds. No organomegaly or masses felt. Ext: Warm, no deformities Skin: No rashes, lesions or ulcers Neuro: Alert and oriented. No focal neurological deficits. Psych: Judgement and insight appear normal. Mood & affect appropriate.   Data Reviewed: I have personally reviewed following labs and imaging studies  CBC: Recent Labs  Lab 11/01/20 0954 11/02/20 0118  WBC 7.7 6.4  NEUTROABS 6.5  --   HGB 13.3 12.5  HCT 42.5 37.8  MCV 101.2* 98.7  PLT 260 241   Basic Metabolic Panel: Recent Labs  Lab 11/01/20 0954 11/02/20 0118  NA 142 138  K 3.8 4.2  CL 105 109  CO2 27 23  GLUCOSE 109* 121*  BUN 12 6*  CREATININE 0.64 0.67  CALCIUM 8.4* 8.0*   GFR: Estimated Creatinine  Clearance: 53.6 mL/min (by C-G formula based on SCr of 0.67 mg/dL). Liver Function Tests: Recent Labs  Lab 11/01/20 0954  AST 25  ALT 19  ALKPHOS 46  BILITOT 0.6  PROT 6.7  ALBUMIN 3.8   Recent Labs  Lab 11/01/20 0954  LIPASE 33   No results for input(s): AMMONIA in the last 168 hours. Coagulation Profile: No results for input(s): INR, PROTIME in the last 168 hours. Cardiac Enzymes: No results for input(s): CKTOTAL, CKMB, CKMBINDEX, TROPONINI in the last 168 hours. BNP (last 3 results) No results for input(s): PROBNP in the last 8760 hours. HbA1C: No results for input(s):  HGBA1C in the last 72 hours. CBG: Recent Labs  Lab 11/02/20 0130 11/02/20 0611 11/02/20 1146 11/02/20 1752  GLUCAP 125* 128* 107* 99   Lipid Profile: No results for input(s): CHOL, HDL, LDLCALC, TRIG, CHOLHDL, LDLDIRECT in the last 72 hours. Thyroid Function Tests: No results for input(s): TSH, T4TOTAL, FREET4, T3FREE, THYROIDAB in the last 72 hours. Anemia Panel: No results for input(s): VITAMINB12, FOLATE, FERRITIN, TIBC, IRON, RETICCTPCT in the last 72 hours. Urine analysis: No results found for: COLORURINE, APPEARANCEUR, LABSPEC, PHURINE, GLUCOSEU, HGBUR, BILIRUBINUR, KETONESUR, PROTEINUR, UROBILINOGEN, NITRITE, LEUKOCYTESUR Recent Results (from the past 240 hour(s))  Resp Panel by RT-PCR (Flu A&B, Covid) Nasopharyngeal Swab     Status: None   Collection Time: 11/01/20 11:38 AM   Specimen: Nasopharyngeal Swab; Nasopharyngeal(NP) swabs in vial transport medium  Result Value Ref Range Status   SARS Coronavirus 2 by RT PCR NEGATIVE NEGATIVE Final    Comment: (NOTE) SARS-CoV-2 target nucleic acids are NOT DETECTED.  The SARS-CoV-2 RNA is generally detectable in upper respiratory specimens during the acute phase of infection. The lowest concentration of SARS-CoV-2 viral copies this assay can detect is 138 copies/mL. A negative result does not preclude SARS-Cov-2 infection and should not be used as the sole basis for treatment or other patient management decisions. A negative result may occur with  improper specimen collection/handling, submission of specimen other than nasopharyngeal swab, presence of viral mutation(s) within the areas targeted by this assay, and inadequate number of viral copies(<138 copies/mL). A negative result must be combined with clinical observations, patient history, and epidemiological information. The expected result is Negative.  Fact Sheet for Patients:  BloggerCourse.com  Fact Sheet for Healthcare Providers:   SeriousBroker.it  This test is no t yet approved or cleared by the [place] FDA and  has been authorized for detection and/or diagnosis of SARS-CoV-2 by FDA under an Emergency Use Authorization (EUA). This EUA will remain  in effect (meaning this test can be used) for the duration of the COVID-19 declaration under Section 564(b)(1) of the Act, 21 U.S.C.section 360bbb-3(b)(1), unless the authorization is terminated  or revoked sooner.       Influenza A by PCR NEGATIVE NEGATIVE Final   Influenza B by PCR NEGATIVE NEGATIVE Final    Comment: (NOTE) The Xpert Xpress SARS-CoV-2/FLU/RSV plus assay is intended as an aid in the diagnosis of influenza from Nasopharyngeal swab specimens and should not be used as a sole basis for treatment. Nasal washings and aspirates are unacceptable for Xpert Xpress SARS-CoV-2/FLU/RSV testing.  Fact Sheet for Patients: BloggerCourse.com  Fact Sheet for Healthcare Providers: SeriousBroker.it  This test is not yet approved or cleared by the [place] FDA and has been authorized for detection and/or diagnosis of SARS-CoV-2 by FDA under an Emergency Use Authorization (EUA). This EUA will remain in effect (meaning this test can be used) for  the duration of the COVID-19 declaration under Section 564(b)(1) of the Act, 21 U.S.C. section 360bbb-3(b)(1), unless the authorization is terminated or revoked.  Performed at [facility], [address] [place], [place] [address]   Surgical pcr screen     Status: None   Collection Time: 11/01/20 11:12 PM   Specimen: Nasal Mucosa; Nasal Swab  Result Value Ref Range Status   MRSA, PCR NEGATIVE NEGATIVE Final   Staphylococcus aureus NEGATIVE NEGATIVE Final    Comment: (NOTE) The Xpert SA Assay (FDA approved for NASAL specimens in patients 7 years of age and older), is one component of a comprehensive surveillance program. It is not  intended to diagnose infection nor to guide or monitor treatment. Performed at [facility], [address], [place], [place] [address]       Radiology Studies: DG Abd 1 View  Result Date: 11/01/2020 CLINICAL DATA:  Colonic volvulus post flexible sigmoidoscopy EXAM: ABDOMEN - 1 VIEW COMPARISON:  Portable exam 1733 hours compared to CT abdomen and pelvis FINDINGS: Stool throughout colon. Contrast within urinary bladder. Colonic distention seen on prior CT exam no longer identified. Small bowel gas pattern normal. No definite bowel wall thickening. Mild osseous demineralization. IMPRESSION: Interval reduction of sigmoid volvulus and decompression of the colon. Electronically Signed   By: Ulyses Southward M.D.   On: 11/01/2020 17:55   CT Abdomen Pelvis W Contrast  Result Date: 11/01/2020 CLINICAL DATA:  Abdominal distension. EXAM: CT ABDOMEN AND PELVIS WITH CONTRAST TECHNIQUE: Multidetector CT imaging of the abdomen and pelvis was performed using the standard protocol following bolus administration of intravenous contrast. CONTRAST:  OMNIPAQUE IOHEXOL 300 MG/ML  SOLN COMPARISON:  None. FINDINGS: Lower chest: The lung bases are clear of an acute process. No pleural effusions or pulmonary lesions. The heart is normal in size. No pericardial effusion. There is a moderate-sized hiatal hernia noted. Hepatobiliary: No hepatic lesions or intrahepatic biliary dilatation. The gallbladder appears normal. No common bile duct dilatation. Pancreas: No mass, inflammation or ductal dilatation. Spleen: Normal size.  No focal lesions. Adrenals/Urinary Tract: The adrenal glands and kidneys are unremarkable. The bladder is unremarkable. Stomach/Bowel: The stomach is relatively decompressed. The small bowel is unremarkable. No distension or obstruction. Markedly distended colon measuring up to 8 cm in some areas. There is moderate stool and some fluid scattered in the colon. Low lying cecum deep in the pelvis and  displacing the rectum to the left. The mid sigmoid colon and rectum are decompressed. There is a definite transition zone at the iliac crest midline with swirling mesenteric vessels and twisting of the sigmoid colon consistent with sigmoid volvulus. Vascular/Lymphatic: The aorta and branch vessels are patent. The major venous structures are patent. No mesenteric or retroperitoneal mass or adenopathy. Reproductive: The uterus and ovaries are grossly normal. A small calcified fibroid is noted. Other: No free air or free fluid is identified. Musculoskeletal: No significant bony findings. Degenerative anterolisthesis of L4 and L5 and associated advanced degenerative disc disease at L4-5 and L5-S1. IMPRESSION: 1. CT findings consistent with a sigmoid volvulus. Markedly distended colon above the volvulus but no findings for perforation or pneumatosis. Recommend surgical consultation. 2. Moderate-sized hiatal hernia. Electronically Signed   By: Rudie Meyer M.D.   On: 11/01/2020 11:12    Scheduled Meds: . [START ON 11/03/2020] alvimopan  12 mg Oral On Call to OR  . Chlorhexidine Gluconate Cloth  6 each Topical Once  . cholecalciferol  400 Units Oral Daily  . enoxaparin (  LOVENOX) injection  40 mg Subcutaneous Q24H  . multivitamin with minerals  1 tablet Oral Daily  . sodium chloride flush  3 mL Intravenous Q12H  . sodium chloride flush  3 mL Intravenous Q12H  . vitamin B-12  500 mcg Oral Daily   Continuous Infusions: . sodium chloride    .  ceFAZolin (ANCEF) IV    . [START ON 11/03/2020] cefoTEtan (CEFOTAN) IV    . dextrose 5 % and 0.45% NaCl 125 mL/hr at 11/02/20 1800     LOS: 1 day   Time spent: 25 minutes.  Tyrone Nine, MD [facility] [email or web address] 11/02/2020, 9:21 PM

## 2020-11-03 ENCOUNTER — Inpatient Hospital Stay (HOSPITAL_COMMUNITY): Payer: Medicare Other | Admitting: Anesthesiology

## 2020-11-03 ENCOUNTER — Encounter (HOSPITAL_COMMUNITY): Payer: Self-pay | Admitting: Family Medicine

## 2020-11-03 ENCOUNTER — Encounter (HOSPITAL_COMMUNITY)
Admission: EM | Disposition: A | Discharge status: Home / Self Care | Payer: Self-pay | Source: Home / Self Care | Attending: Family Medicine

## 2020-11-03 HISTORY — PX: LAPAROTOMY: SHX154

## 2020-11-03 HISTORY — PX: PARTIAL COLECTOMY: SHX5273

## 2020-11-03 HISTORY — PX: LAPAROSCOPIC ABDOMINAL EXPLORATION: SHX6249

## 2020-11-03 LAB — TYPE AND SCREEN
ABO/RH(D): A NEG
Antibody Screen: NEGATIVE

## 2020-11-03 LAB — ABO/RH: ABO/RH(D): A NEG

## 2020-11-03 LAB — GLUCOSE, CAPILLARY
Glucose-Capillary: 107 mg/dL — ABNORMAL HIGH (ref 70–99)
Glucose-Capillary: 110 mg/dL — ABNORMAL HIGH (ref 70–99)
Glucose-Capillary: 140 mg/dL — ABNORMAL HIGH (ref 70–99)
Glucose-Capillary: 152 mg/dL — ABNORMAL HIGH (ref 70–99)
Glucose-Capillary: 164 mg/dL — ABNORMAL HIGH (ref 70–99)
Glucose-Capillary: 90 mg/dL (ref 70–99)

## 2020-11-03 SURGERY — COLECTOMY, PARTIAL
Anesthesia: General | Site: Abdomen

## 2020-11-03 MED ORDER — 0.9 % SODIUM CHLORIDE (POUR BTL) OPTIME
TOPICAL | Status: DC | PRN
  Administered 2020-11-03: 2000 mL
  Administered 2020-11-03: 1000 mL

## 2020-11-03 MED ORDER — ONDANSETRON HCL 4 MG/2ML IJ SOLN
INTRAMUSCULAR | Status: DC | PRN
  Administered 2020-11-03: 4 mg via INTRAVENOUS

## 2020-11-03 MED ORDER — PHENYLEPHRINE 40 MCG/ML (10ML) SYRINGE FOR IV PUSH (FOR BLOOD PRESSURE SUPPORT)
PREFILLED_SYRINGE | INTRAVENOUS | Status: DC | PRN
  Administered 2020-11-03: 160 ug via INTRAVENOUS

## 2020-11-03 MED ORDER — OXYCODONE HCL 5 MG PO TABS
5.0000 mg | ORAL_TABLET | ORAL | Status: DC | PRN
  Administered 2020-11-03 – 2020-11-04 (×4): 5 mg via ORAL
  Filled 2020-11-03 (×3): qty 1
  Filled 2020-11-03: qty 2

## 2020-11-03 MED ORDER — LIDOCAINE 2% (20 MG/ML) 5 ML SYRINGE
INTRAMUSCULAR | Status: DC | PRN
  Administered 2020-11-03: 50 mg via INTRAVENOUS

## 2020-11-03 MED ORDER — PROPOFOL 500 MG/50ML IV EMUL
INTRAVENOUS | Status: DC | PRN
  Administered 2020-11-03: 125 ug/kg/min via INTRAVENOUS

## 2020-11-03 MED ORDER — CHLORHEXIDINE GLUCONATE 0.12 % MT SOLN
15.0000 mL | Freq: Once | OROMUCOSAL | Status: AC
  Administered 2020-11-03: 15 mL via OROMUCOSAL

## 2020-11-03 MED ORDER — MIDAZOLAM HCL 2 MG/2ML IJ SOLN
INTRAMUSCULAR | Status: AC
  Filled 2020-11-03: qty 2

## 2020-11-03 MED ORDER — CHLORHEXIDINE GLUCONATE 0.12 % MT SOLN
15.0000 mL | OROMUCOSAL | Status: DC
  Filled 2020-11-03: qty 15

## 2020-11-03 MED ORDER — LACTATED RINGERS IV SOLN: INTRAVENOUS | Status: DC

## 2020-11-03 MED ORDER — ROCURONIUM BROMIDE 10 MG/ML (PF) SYRINGE
PREFILLED_SYRINGE | INTRAVENOUS | Status: DC | PRN
  Administered 2020-11-03: 10 mg via INTRAVENOUS
  Administered 2020-11-03: 60 mg via INTRAVENOUS

## 2020-11-03 MED ORDER — DIPHENHYDRAMINE HCL 50 MG/ML IJ SOLN
INTRAMUSCULAR | Status: DC | PRN
  Administered 2020-11-03: 6.25 mg via INTRAVENOUS

## 2020-11-03 MED ORDER — FENTANYL CITRATE (PF) 250 MCG/5ML IJ SOLN
INTRAMUSCULAR | Status: AC
  Filled 2020-11-03: qty 5

## 2020-11-03 MED ORDER — SUGAMMADEX SODIUM 200 MG/2ML IV SOLN
INTRAVENOUS | Status: DC | PRN
  Administered 2020-11-03: 200 mg via INTRAVENOUS

## 2020-11-03 MED ORDER — PROMETHAZINE HCL 25 MG/ML IJ SOLN: 6.2500 mg | INTRAMUSCULAR | Status: DC | PRN

## 2020-11-03 MED ORDER — DEXTROSE-NACL 5-0.45 % IV SOLN: INTRAVENOUS | Status: DC

## 2020-11-03 MED ORDER — HYDROMORPHONE HCL 1 MG/ML IJ SOLN
0.5000 mg | INTRAMUSCULAR | Status: DC | PRN
  Administered 2020-11-03: 0.5 mg via INTRAVENOUS
  Filled 2020-11-03: qty 1

## 2020-11-03 MED ORDER — ALVIMOPAN 12 MG PO CAPS
12.0000 mg | ORAL_CAPSULE | Freq: Two times a day (BID) | ORAL | Status: DC
  Administered 2020-11-04 – 2020-11-06 (×6): 12 mg via ORAL
  Filled 2020-11-03 (×7): qty 1

## 2020-11-03 MED ORDER — BUPIVACAINE-EPINEPHRINE (PF) 0.25% -1:200000 IJ SOLN
INTRAMUSCULAR | Status: AC
  Filled 2020-11-03: qty 30

## 2020-11-03 MED ORDER — CHLORHEXIDINE GLUCONATE CLOTH 2 % EX PADS
6.0000 | MEDICATED_PAD | Freq: Every day | CUTANEOUS | Status: DC
  Administered 2020-11-03 – 2020-11-06 (×3): 6 via TOPICAL

## 2020-11-03 MED ORDER — PROPOFOL 10 MG/ML IV BOLUS
INTRAVENOUS | Status: DC | PRN
  Administered 2020-11-03: 100 mg via INTRAVENOUS

## 2020-11-03 MED ORDER — FENTANYL CITRATE (PF) 250 MCG/5ML IJ SOLN
INTRAMUSCULAR | Status: DC | PRN
  Administered 2020-11-03 (×5): 50 ug via INTRAVENOUS
  Administered 2020-11-03: 100 ug via INTRAVENOUS
  Administered 2020-11-03: 50 ug via INTRAVENOUS

## 2020-11-03 MED ORDER — BUPIVACAINE-EPINEPHRINE 0.25% -1:200000 IJ SOLN
INTRAMUSCULAR | Status: DC | PRN
  Administered 2020-11-03: 8 mL

## 2020-11-03 MED ORDER — BUDESONIDE 0.25 MG/2ML IN SUSP
0.2500 mg | Freq: Two times a day (BID) | RESPIRATORY_TRACT | Status: DC
  Administered 2020-11-03 – 2020-11-07 (×6): 0.25 mg via RESPIRATORY_TRACT
  Filled 2020-11-03 (×8): qty 2

## 2020-11-03 MED ORDER — DEXAMETHASONE SODIUM PHOSPHATE 10 MG/ML IJ SOLN
INTRAMUSCULAR | Status: DC | PRN
  Administered 2020-11-03: 5 mg via INTRAVENOUS

## 2020-11-03 MED ORDER — ACETAMINOPHEN 500 MG PO TABS
1000.0000 mg | ORAL_TABLET | Freq: Once | ORAL | Status: AC
  Administered 2020-11-03: 1000 mg via ORAL
  Filled 2020-11-03: qty 2

## 2020-11-03 MED ORDER — FENTANYL CITRATE (PF) 100 MCG/2ML IJ SOLN: 25.0000 ug | INTRAMUSCULAR | Status: DC | PRN

## 2020-11-03 SURGICAL SUPPLY — 79 items
APPLIER CLIP ROT 10 11.4 M/L (STAPLE)
BLADE CLIPPER SURG (BLADE) IMPLANT
BLADE SURG 10 STRL SS (BLADE) ×1 IMPLANT
CANISTER SUCT 3000ML PPV (MISCELLANEOUS) ×4 IMPLANT
CELLS DAT CNTRL 66122 CELL SVR (MISCELLANEOUS) IMPLANT
CHLORAPREP W/TINT 26 (MISCELLANEOUS) ×1 IMPLANT
CLIP APPLIE ROT 10 11.4 M/L (STAPLE) IMPLANT
COVER MAYO STAND STRL (DRAPES) ×2 IMPLANT
COVER SURGICAL LIGHT HANDLE (MISCELLANEOUS) ×8 IMPLANT
COVER WAND RF STERILE (DRAPES) ×4 IMPLANT
DRAPE HALF SHEET 40X57 (DRAPES) ×1 IMPLANT
DRAPE UTILITY XL STRL (DRAPES) ×1 IMPLANT
DRAPE WARM FLUID 44X44 (DRAPES) ×1 IMPLANT
DRSG OPSITE POSTOP 4X10 (GAUZE/BANDAGES/DRESSINGS) IMPLANT
DRSG OPSITE POSTOP 4X8 (GAUZE/BANDAGES/DRESSINGS) IMPLANT
DRSG TEGADERM 2-3/8X2-3/4 SM (GAUZE/BANDAGES/DRESSINGS) ×3 IMPLANT
ELECT BLADE 6.5 EXT (BLADE) ×1 IMPLANT
ELECT CAUTERY BLADE 6.4 (BLADE) ×4 IMPLANT
ELECT REM PT RETURN 9FT ADLT (ELECTROSURGICAL) ×4
ELECTRODE REM PT RTRN 9FT ADLT (ELECTROSURGICAL) ×2 IMPLANT
GAUZE 4X4 16PLY RFD (DISPOSABLE) ×3 IMPLANT
GAUZE SPONGE 2X2 8PLY STRL LF (GAUZE/BANDAGES/DRESSINGS) ×1 IMPLANT
GLOVE BIO SURGEON STRL SZ7.5 (GLOVE) ×8 IMPLANT
GOWN STRL REUS W/ TWL LRG LVL3 (GOWN DISPOSABLE) ×14 IMPLANT
GOWN STRL REUS W/TWL LRG LVL3 (GOWN DISPOSABLE) ×12
HANDLE SUCTION POOLE (INSTRUMENTS) ×2 IMPLANT
KIT BASIN OR (CUSTOM PROCEDURE TRAY) ×4 IMPLANT
KIT SIGMOIDOSCOPE (SET/KITS/TRAYS/PACK) ×1 IMPLANT
KIT TURNOVER KIT B (KITS) ×4 IMPLANT
LEGGING LITHOTOMY PAIR STRL (DRAPES) ×4 IMPLANT
LIGASURE IMPACT 36 18CM CVD LR (INSTRUMENTS) ×3 IMPLANT
NS IRRIG 1000ML POUR BTL (IV SOLUTION) ×8 IMPLANT
PACK COLON (CUSTOM PROCEDURE TRAY) ×3 IMPLANT
PAD ARMBOARD 7.5X6 YLW CONV (MISCELLANEOUS) ×8 IMPLANT
PENCIL SMOKE EVACUATOR (MISCELLANEOUS) ×5 IMPLANT
RELOAD PROXIMATE 75MM BLUE (ENDOMECHANICALS) ×8 IMPLANT
RELOAD STAPLE 75 3.8 BLU REG (ENDOMECHANICALS) IMPLANT
RETRACTOR WND ALEXIS 18 MED (MISCELLANEOUS) IMPLANT
RTRCTR WOUND ALEXIS 18CM MED (MISCELLANEOUS)
SCISSORS LAP 5X35 DISP (ENDOMECHANICALS) ×4 IMPLANT
SET IRRIG TUBING LAPAROSCOPIC (IRRIGATION / IRRIGATOR) IMPLANT
SET TUBE SMOKE EVAC HIGH FLOW (TUBING) ×4 IMPLANT
SHEARS HARMONIC ACE PLUS 36CM (ENDOMECHANICALS) ×1 IMPLANT
SLEEVE ENDOPATH XCEL 5M (ENDOMECHANICALS) ×5 IMPLANT
SPECIMEN JAR LARGE (MISCELLANEOUS) ×4 IMPLANT
SPONGE GAUZE 2X2 STER 10/PKG (GAUZE/BANDAGES/DRESSINGS) ×2
SPONGE LAP 18X18 RF (DISPOSABLE) ×6 IMPLANT
STAPLER GUN LINEAR PROX 60 (STAPLE) ×3 IMPLANT
STAPLER PROXIMATE 75MM BLUE (STAPLE) ×3 IMPLANT
STAPLER VISISTAT 35W (STAPLE) ×4 IMPLANT
SUCTION POOLE HANDLE (INSTRUMENTS) ×4
SURGILUBE 2OZ TUBE FLIPTOP (MISCELLANEOUS) ×1 IMPLANT
SUT PDS AB 1 CT  36 (SUTURE)
SUT PDS AB 1 CT 36 (SUTURE) ×2 IMPLANT
SUT PDS AB 1 TP1 96 (SUTURE) ×6 IMPLANT
SUT PROLENE 2 0 CT2 30 (SUTURE) IMPLANT
SUT PROLENE 2 0 KS (SUTURE) IMPLANT
SUT PROLENE 2 0 SH 30 (SUTURE) IMPLANT
SUT SILK 2 0 (SUTURE) ×2
SUT SILK 2 0 SH CR/8 (SUTURE) ×4 IMPLANT
SUT SILK 2-0 18XBRD TIE 12 (SUTURE) ×2 IMPLANT
SUT SILK 3 0 (SUTURE) ×2
SUT SILK 3 0 SH CR/8 (SUTURE) ×4 IMPLANT
SUT SILK 3-0 18XBRD TIE 12 (SUTURE) ×2 IMPLANT
SUT VICRYL 0 UR6 27IN ABS (SUTURE) ×3 IMPLANT
SYR BULB IRRIG 60ML STRL (SYRINGE) ×2 IMPLANT
SYS LAPSCP GELPORT 120MM (MISCELLANEOUS) ×4
SYSTEM LAPSCP GELPORT 120MM (MISCELLANEOUS) ×1 IMPLANT
TOWEL GREEN STERILE (TOWEL DISPOSABLE) ×8 IMPLANT
TRAY FOLEY MTR SLVR 16FR STAT (SET/KITS/TRAYS/PACK) ×4 IMPLANT
TRAY LAPAROSCOPIC MC (CUSTOM PROCEDURE TRAY) ×1 IMPLANT
TROCAR XCEL 12X100 BLDLESS (ENDOMECHANICALS) IMPLANT
TROCAR XCEL BLUNT TIP 100MML (ENDOMECHANICALS) ×3 IMPLANT
TROCAR XCEL NON-BLD 11X100MML (ENDOMECHANICALS) IMPLANT
TROCAR XCEL NON-BLD 5MMX100MML (ENDOMECHANICALS) ×4 IMPLANT
TUBE CONNECTING 12'X1/4 (SUCTIONS) ×2
TUBE CONNECTING 12X1/4 (SUCTIONS) ×6 IMPLANT
WATER STERILE IRR 1000ML POUR (IV SOLUTION) ×4 IMPLANT
YANKAUER SUCT BULB TIP NO VENT (SUCTIONS) ×2 IMPLANT

## 2020-11-03 NOTE — Transfer of Care (Signed)
Immediate Anesthesia Transfer of Care Note  Patient: Meredith Walter  Procedure(s) Performed: SIGMOID COLECTOMY (Abdomen) EXPLORATORY LAPAROTOMY (Abdomen) LAPAROSCOPIC ABDOMINAL EXPLORATION (Abdomen)  Patient Location: PACU  Anesthesia Type:General  Level of Consciousness: drowsy, patient cooperative and responds to stimulation  Airway & Oxygen Therapy: Patient Spontanous Breathing  Post-op Assessment: Report given to RN and Post -op Vital signs reviewed and stable  Post vital signs: Reviewed and stable  Last Vitals:  Vitals Value Taken Time  BP    Temp    Pulse 70 11/03/20 1321  Resp 10 11/03/20 1321  SpO2 95 % 11/03/20 1321  Vitals shown include unvalidated device data.  Last Pain:  Vitals:   11/03/20 0815  TempSrc:   PainSc: 0-No pain      Patients Stated Pain Goal: 7 (11/01/20 1645)  Complications: No complications documented.

## 2020-11-03 NOTE — Op Note (Signed)
11/01/2020 - 11/03/2020  1:06 PM  PATIENT:  Meredith Walter  65 y.o. female  PRE-OPERATIVE DIAGNOSIS:  SIGMOID VOLVULUS  POST-OPERATIVE DIAGNOSIS:  SIGMOID VOLVULUS  PROCEDURE:  Procedure(s): DIAGNOSTIC LAPAROSCOPY SIGMOID COLECTOMY  SURGEON:  Surgeon(s) and Role:    * Griselda Miner, MD - Primary  PHYSICIAN ASSISTANT:   ASSISTANTS: Edwena Blow, PA   ANESTHESIA:   general  EBL:  minimal   BLOOD ADMINISTERED:none  DRAINS: none   LOCAL MEDICATIONS USED:  MARCAINE     SPECIMEN:  Source of Specimen:  sigmoid colon  DISPOSITION OF SPECIMEN:  PATHOLOGY  COUNTS:  YES  TOURNIQUET:  * No tourniquets in log *  DICTATION: .Dragon Dictation   After informed consent was obtained the patient was brought to the operating room and placed in the supine position on the operating table.  After adequate induction of general anesthesia the patient's abdomen was prepped with ChloraPrep, allowed to dry, and draped in usual sterile manner.  The perineum was also prepped with Betadine.  The patient was placed in lithotomy prior to prepping and draping.  An appropriate timeout was performed.  The area below the umbilicus was infiltrated with quarter percent Marcaine.  A small incision was made with a 15 blade knife.  The incision was carried through the subcutaneous tissue bluntly with a hemostat until the linea alba identified.  The linea alba was incised with the 15 blade knife.  Each side was grasped with Kocher clamps and elevated anteriorly.  The preperitoneal space was probed bluntly with a hemostat until the peritoneum was opened and access was gained to the abdominal cavity.  A 0 Vicryl pursestring stitch was placed in the fashion around the opening.  A Hassan cannula placed through the opening in anchored in place with the previously placed Vicryl pursestring stitch.  The abdomen insufflated with carbon dioxide without difficulty.  A 5 mm port was placed in the right lower quadrant under  direct vision.  The sigmoid colon was identified.  There was a lot of redundant loops of colon and it was difficult to tell whether we were looking at left colon and right colon or whether all of the redundant loops belong to the sigmoid colon.  Because of this I elected to make a lower midline incision which was opened under direct vision.  A wound protector was applied.  We were Eiad able to identify the sigmoid colon with its redundancy.  The colon was actually pretty clean.  I then chose sites above and below the area of redundancy where the colon appeared healthy and relatively normal caliber.  The mesentery to the sites was opened sharply with the electrocautery.  Each area was then divided with a single firing of a GIA-75 stapler.  The proximal and distal segments matched each other easily.  A small opening was made on the antimesenteric border of each limb of colon.  Each limb of a GIA-75 stapler was then placed down the appropriate limb of colon, clamped, and fired thereby creating a nice widely patent enteroenterostomy.  The common opening was closed with a single firing of a TA 60 stapler.  The staple line was then imbricated with multiple 2-0 silk Lembert stitches as well as a 2-0 silk crotch stitch.  The mesenteric defect was closed with interrupted 2-0 silk figure-of-eight stitches.  Once this was accomplished the anastomosis appeared widely patent and healthy with no tension.  It was placed back in the abdominal cavity.  The abdomen is then  irrigated with copious amounts of saline.  The right colon was also examined and appeared to be tethered to the wall of the pelvis.  This was freed up sharply with the electrocautery.  There appeared to be some kinking from scar tissue of the cecum and this was also freed up sharply with the electrocautery so that the cecum although the dilated and redundant was set in a nice position in the abdominal cavity.  No other abnormalities were noted on general inspection  of the abdomen.  At this point the fascial defect was closed with 2 running #1 double-stranded looped PDS sutures.  The subcutaneous tissue was irrigated with copious amounts of saline.  The skin was closed with staples and sterile dressings were applied.  The patient tolerated the procedure well.  At the end of the case all needle sponge and instrument counts were correct.  The patient was then awakened and taken to recovery in stable condition.  PLAN OF CARE: Admit to inpatient   PATIENT DISPOSITION:  PACU - hemodynamically stable.   Delay start of Pharmacological VTE agent (>24hrs) due to surgical blood loss or risk of bleeding: no

## 2020-11-03 NOTE — Anesthesia Procedure Notes (Signed)
Procedure Name: Intubation Date/Time: 11/03/2020 11:20 AM Performed by: Catalina Gravel, MD Pre-anesthesia Checklist: Patient identified, Emergency Drugs available, Suction available and Patient being monitored Patient Re-evaluated:Patient Re-evaluated prior to induction Oxygen Delivery Method: Circle system utilized Preoxygenation: Pre-oxygenation with 100% oxygen Induction Type: IV induction Ventilation: Mask ventilation without difficulty Laryngoscope Size: Mac and 3 Grade View: Grade I Tube type: Oral Tube size: 7.0 mm Number of attempts: 1 Airway Equipment and Method: Stylet and Oral airway Placement Confirmation: ETT inserted through vocal cords under direct vision,  positive ETCO2 and breath sounds checked- equal and bilateral Secured at: 23 cm Tube secured with: Tape Dental Injury: Teeth and Oropharynx as per pre-operative assessment

## 2020-11-03 NOTE — Interval H&P Note (Signed)
History and Physical Interval Note:  11/03/2020 10:40 AM  Meredith Walter  has presented today for surgery, with the diagnosis of SIGMOID VOLVULUS.  The various methods of treatment have been discussed with the patient and family. After consideration of risks, benefits and other options for treatment, the patient has consented to  Procedure(s) with comments: LAPAROSCOPIC SIGMOID COLECTOMY (N/A) - ROOM 1 STARTING AT 02:30PM FOR 120 MIN POSSIBLE COLOSTOMY (N/A) as a surgical intervention.  The patient's history has been reviewed, patient examined, no change in status, stable for surgery.  I have reviewed the patient's chart and labs.  Questions were answered to the patient's satisfaction.     Chevis Pretty III

## 2020-11-03 NOTE — Progress Notes (Signed)
PROGRESS NOTE  Meredith Walter  ZOX:[id number] DOB: 1956-05-14 DOA: 11/01/2020 PCP: Patient, No Pcp Per   Brief Narrative: Meredith Walter is a 65 y.o. female with a history of fragile X syndrome who was traveling to [place] from her home in [place] by motor home with her husband when she developed abdominal pain and distention in [place], [place] prompting evaluation in the ED. She was found to have sigmoid volvulus which was reduced endoscopically. She was transferred to [facility] for sigmoid colectomy on 3/17 under general anesthesia (no inhalational anesthesia was used to reduce risk of complications due to fragile X syndrome).   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  - Surgery performed sigmoid colectomy 3/17. Appreciate their management of pain control, wound care.   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  - No anesthetic complications currently noted.   Asthma: Well-controlled.  - Restart home controller.  DVT prophylaxis: Lovenox Code Status: Full Family Communication: Husband at bedside. Disposition Plan:  Status is: Inpatient  Remains inpatient appropriate because:Inpatient level of care appropriate due to severity of illness  Dispo: The patient is from: Home              Anticipated d/c is to: Home              Patient currently is not medically stable to d/c.  Consultants:   General surgery  Anesthesia  GI  Procedures:   Sigmoidoscopy  Antimicrobials:  Cefotetan per surgery   Subjective: Pain in belly radiating up to shoulders bilaterally is mild-moderate, would prefer to take as little pain medication  as possible but was admonished of risks of getting behind immediately postoperative pain. No N/V. Took some clears. Using IS.   Objective: Vitals:   11/03/20 1320 11/03/20 1335 11/03/20 1350 11/03/20 1409  BP: 139/67 (!) 143/68 (!) 145/58 139/69  Pulse: 70 61 64 65  Resp: 12 13 16 17   Temp: (!) 97.3 F (36.3 C)  (!) 97.3 F (36.3 C) 97.7 F (36.5 C)  TempSrc:    Oral  SpO2: 94% 96% 98% 97%  Weight:      Height:        Intake/Output Summary (Last 24 hours) at 11/03/2020 1839 Last data filed at 11/03/2020 1824 Gross per 24 hour  Intake 3085.1 ml  Output 250 ml  Net 2835.1 ml   Filed Weights   11/02/20 0500 11/03/20 0629 11/03/20 1039  Weight: 48.4 kg 47.3 kg 47.3 kg   Gen: 65 y.o. female in no distress Pulm: Nonlabored breathing room air. Clear, no wheezes. CV: Regular rate and rhythm. No murmur, rub, or gallop. No JVD, no dependent edema. GI: Abdomen soft, appropriately tender, +BS. Midline incision with well apposed wound edges, hemostatic without discharge, seen under honeycomb dressing.  Ext: Warm, no deformities Skin: No other rashes, lesions or ulcers on visualized skin. Neuro: Alert and oriented. No focal neurological deficits. Psych: Judgement and insight appear fair. Mood euthymic & affect congruent. Behavior is appropriate.    Data Reviewed: I have personally reviewed following labs and imaging studies  CBC: Recent Labs  Lab 11/01/20 0954 11/02/20 0118  WBC 7.7 6.4  NEUTROABS 6.5  --  HGB 13.3 12.5  HCT 42.5 37.8  MCV 101.2* 98.7  PLT 260 241   Basic Metabolic Panel: Recent Labs  Lab 11/01/20 0954 11/02/20 0118  NA 142 138  K 3.8 4.2  CL 105 109  CO2 27 23  GLUCOSE 109* 121*  BUN 12 6*  CREATININE 0.64 0.67  CALCIUM 8.4* 8.0*   GFR: Estimated Creatinine Clearance: 52.4 mL/min (by C-G formula based on SCr of 0.67 mg/dL). Liver Function Tests: Recent Labs  Lab 11/01/20 0954  AST 25  ALT 19  ALKPHOS 46  BILITOT 0.6  PROT 6.7  ALBUMIN  3.8   Recent Labs  Lab 11/01/20 0954  LIPASE 33   No results for input(s): AMMONIA in the last 168 hours. Coagulation Profile: No results for input(s): INR, PROTIME in the last 168 hours. Cardiac Enzymes: No results for input(s): CKTOTAL, CKMB, CKMBINDEX, TROPONINI in the last 168 hours. BNP (last 3 results) No results for input(s): PROBNP in the last 8760 hours. HbA1C: No results for input(s): HGBA1C in the last 72 hours. CBG: Recent Labs  Lab 11/02/20 2359 11/03/20 0625 11/03/20 1016 11/03/20 1604 11/03/20 1758  GLUCAP 110* 107* 90 140* 164*   Lipid Profile: No results for input(s): CHOL, HDL, LDLCALC, TRIG, CHOLHDL, LDLDIRECT in the last 72 hours. Thyroid Function Tests: No results for input(s): TSH, T4TOTAL, FREET4, T3FREE, THYROIDAB in the last 72 hours. Anemia Panel: No results for input(s): VITAMINB12, FOLATE, FERRITIN, TIBC, IRON, RETICCTPCT in the last 72 hours. Urine analysis: No results found for: COLORURINE, APPEARANCEUR, LABSPEC, PHURINE, GLUCOSEU, HGBUR, BILIRUBINUR, KETONESUR, PROTEINUR, UROBILINOGEN, NITRITE, LEUKOCYTESUR Recent Results (from the past 240 hour(s))  Resp Panel by RT-PCR (Flu A&B, Covid) Nasopharyngeal Swab     Status: None   Collection Time: 11/01/20 11:38 AM   Specimen: Nasopharyngeal Swab; Nasopharyngeal(NP) swabs in vial transport medium  Result Value Ref Range Status   SARS Coronavirus 2 by RT PCR NEGATIVE NEGATIVE Final    Comment: (NOTE) SARS-CoV-2 target nucleic acids are NOT DETECTED.  The SARS-CoV-2 RNA is generally detectable in upper respiratory specimens during the acute phase of infection. The lowest concentration of SARS-CoV-2 viral copies this assay can detect is 138 copies/mL. A negative result does not preclude SARS-Cov-2 infection and should not be used as the sole basis for treatment or other patient management decisions. A negative result may occur with  improper specimen collection/handling, submission of specimen  other than nasopharyngeal swab, presence of viral mutation(s) within the areas targeted by this assay, and inadequate number of viral copies(<138 copies/mL). A negative result must be combined with clinical observations, patient history, and epidemiological information. The expected result is Negative.  Fact Sheet for Patients:  BloggerCourse.com  Fact Sheet for Healthcare Providers:  SeriousBroker.it  This test is no t yet approved or cleared by the [place] FDA and  has been authorized for detection and/or diagnosis of SARS-CoV-2 by FDA under an Emergency Use Authorization (EUA). This EUA will remain  in effect (meaning this test can be used) for the duration of the COVID-19 declaration under Section 564(b)(1) of the Act, 21 U.S.C.section 360bbb-3(b)(1), unless the authorization is terminated  or revoked sooner.       Influenza A by PCR NEGATIVE NEGATIVE Final   Influenza B by PCR NEGATIVE NEGATIVE Final    Comment: (NOTE) The Xpert Xpress SARS-CoV-2/FLU/RSV plus assay is intended as an aid in the diagnosis of influenza from Nasopharyngeal swab specimens and should not be used as a sole basis for treatment. Nasal  washings and aspirates are unacceptable for Xpert Xpress SARS-CoV-2/FLU/RSV testing.  Fact Sheet for Patients: BloggerCourse.com  Fact Sheet for Healthcare Providers: SeriousBroker.it  This test is not yet approved or cleared by the [place] FDA and has been authorized for detection and/or diagnosis of SARS-CoV-2 by FDA under an Emergency Use Authorization (EUA). This EUA will remain in effect (meaning this test can be used) for the duration of the COVID-19 declaration under Section 564(b)(1) of the Act, 21 U.S.C. section 360bbb-3(b)(1), unless the authorization is terminated or revoked.  Performed at [facility], [address] [place], [place]  [address]   Surgical pcr screen     Status: None   Collection Time: 11/01/20 11:12 PM   Specimen: Nasal Mucosa; Nasal Swab  Result Value Ref Range Status   MRSA, PCR NEGATIVE NEGATIVE Final   Staphylococcus aureus NEGATIVE NEGATIVE Final    Comment: (NOTE) The Xpert SA Assay (FDA approved for NASAL specimens in patients 75 years of age and older), is one component of a comprehensive surveillance program. It is not intended to diagnose infection nor to guide or monitor treatment. Performed at [facility], [address] [place], [place] [address]       Radiology Studies: No results found.  Scheduled Meds: . [START ON 11/04/2020] alvimopan  12 mg Oral BID  . budesonide (PULMICORT) nebulizer solution  0.25 mg Nebulization BID  . chlorhexidine  15 mL Mouth/Throat NOW  . Chlorhexidine Gluconate Cloth  6 each Topical Daily  . cholecalciferol  400 Units Oral Daily  . multivitamin with minerals  1 tablet Oral Daily  . sodium chloride flush  3 mL Intravenous Q12H  . sodium chloride flush  3 mL Intravenous Q12H  . vitamin B-12  500 mcg Oral Daily   Continuous Infusions: . sodium chloride    . dextrose 5 % and 0.45% NaCl 100 mL/hr at 11/03/20 1419     LOS: 2 days   Time spent: 25 minutes.  Tyrone Nine, MD [facility] [email or web address] 11/03/2020, 6:39 PM

## 2020-11-03 NOTE — Anesthesia Postprocedure Evaluation (Signed)
Anesthesia Post Note  Patient: Meredith Walter  Procedure(s) Performed: SIGMOID COLECTOMY (Abdomen) EXPLORATORY LAPAROTOMY (Abdomen) LAPAROSCOPIC ABDOMINAL EXPLORATION (Abdomen)     Patient location during evaluation: PACU Anesthesia Type: General Level of consciousness: awake and alert Pain management: pain level controlled Vital Signs Assessment: post-procedure vital signs reviewed and stable Respiratory status: spontaneous breathing, nonlabored ventilation, respiratory function stable and patient connected to nasal cannula oxygen Cardiovascular status: blood pressure returned to baseline and stable Postop Assessment: no apparent nausea or vomiting Anesthetic complications: no   No complications documented.  Last Vitals:  Vitals:   11/03/20 1350 11/03/20 1409  BP: (!) 145/58 139/69  Pulse: 64 65  Resp: 16 17  Temp: (!) 36.3 C 36.5 C  SpO2: 98% 97%    Last Pain:  Vitals:   11/03/20 1409  TempSrc: Oral  PainSc:                  Cecile Hearing

## 2020-11-03 NOTE — Anesthesia Preprocedure Evaluation (Addendum)
Anesthesia Evaluation  Patient identified by MRN, date of birth, ID band Patient awake    Reviewed: Allergy & Precautions, NPO status , Patient's Chart, lab work & pertinent test results  Airway Mallampati: II  TM Distance: >3 FB Neck ROM: Full    Dental  (+) Dental Advisory Given, Caps,    Pulmonary asthma ,    Pulmonary exam normal breath sounds clear to auscultation       Cardiovascular negative cardio ROS Normal cardiovascular exam Rhythm:Regular Rate:Normal     Neuro/Psych negative neurological ROS     GI/Hepatic Neg liver ROS, SIGMOID VOLVULUS   Endo/Other  negative endocrine ROS  Renal/GU negative Renal ROS     Musculoskeletal negative musculoskeletal ROS (+)   Abdominal   Peds  Hematology negative hematology ROS (+) Fragile X   Anesthesia Other Findings Day of surgery medications reviewed with the patient.  Reproductive/Obstetrics                           Anesthesia Physical Anesthesia Plan  ASA: III  Anesthesia Plan: General   Post-op Pain Management:    Induction: Intravenous  PONV Risk Score and Plan: 4 or greater and Midazolam, Diphenhydramine, Dexamethasone, TIVA and Ondansetron  Airway Management Planned: Oral ETT  Additional Equipment:   Intra-op Plan:   Post-operative Plan: Extubation in OR  Informed Consent: I have reviewed the patients History and Physical, chart, labs and discussed the procedure including the risks, benefits and alternatives for the proposed anesthesia with the patient or authorized representative who has indicated his/her understanding and acceptance.     Dental advisory given  Plan Discussed with: CRNA  Anesthesia Plan Comments:         Anesthesia Quick Evaluation

## 2020-11-04 ENCOUNTER — Encounter (HOSPITAL_COMMUNITY): Payer: Self-pay | Admitting: General Surgery

## 2020-11-04 LAB — CBC
HCT: 42.3 % (ref 36.0–46.0)
Hemoglobin: 13.9 g/dL (ref 12.0–15.0)
MCH: 31.9 pg (ref 26.0–34.0)
MCHC: 32.9 g/dL (ref 30.0–36.0)
MCV: 97 fL (ref 80.0–100.0)
Platelets: 253 10*3/uL (ref 150–400)
RBC: 4.36 MIL/uL (ref 3.87–5.11)
RDW: 11.8 % (ref 11.5–15.5)
WBC: 11.9 10*3/uL — ABNORMAL HIGH (ref 4.0–10.5)
nRBC: 0 % (ref 0.0–0.2)

## 2020-11-04 LAB — BASIC METABOLIC PANEL
Anion gap: 7 (ref 5–15)
BUN: <5 mg/dL — ABNORMAL LOW (ref 8–23)
CO2: 26 mmol/L (ref 22–32)
Calcium: 8.5 mg/dL — ABNORMAL LOW (ref 8.9–10.3)
Chloride: 105 mmol/L (ref 98–111)
Creatinine, Ser: 0.78 mg/dL (ref 0.44–1.00)
GFR, Estimated: >60 mL/min (ref >60)
Glucose, Bld: 132 mg/dL — ABNORMAL HIGH (ref 70–99)
Potassium: 4.3 mmol/L (ref 3.5–5.1)
Sodium: 138 mmol/L (ref 135–145)

## 2020-11-04 LAB — GLUCOSE, CAPILLARY
Glucose-Capillary: 136 mg/dL — ABNORMAL HIGH (ref 70–99)
Glucose-Capillary: 120 mg/dL — ABNORMAL HIGH (ref 70–99)

## 2020-11-04 LAB — SURGICAL PATHOLOGY

## 2020-11-04 MED ORDER — ENOXAPARIN SODIUM 40 MG/0.4ML ~~LOC~~ SOLN
40.0000 mg | SUBCUTANEOUS | Status: DC
  Administered 2020-11-04 – 2020-11-06 (×3): 40 mg via SUBCUTANEOUS
  Filled 2020-11-04 (×3): qty 0.4

## 2020-11-04 NOTE — Progress Notes (Signed)
PROGRESS NOTE  Meredith Walter  HEN:[id number] DOB: 12-06-1955 DOA: 11/01/2020 PCP: Patient, No Pcp Per   Brief Narrative: Meredith Walter is a 65 y.o. female with a history of fragile X syndrome who was traveling to [place] from her home in [place] by motor home with her husband when she developed abdominal pain and distention in [place], [place] prompting evaluation in the ED. She was found to have sigmoid volvulus which was reduced endoscopically. She was transferred to [facility] for sigmoid colectomy on 3/17 under general anesthesia (no inhalational anesthesia was used to reduce risk of complications due to fragile X syndrome).   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  - Surgery performed sigmoid colectomy 3/17.  - CLD per surgery - Incentive spirometry - Appreciate their management of pain control, wound care.  - Restart VTE ppx per their ok.  Constipation post-op:  - Continue entereg, CLD. DC IVF.  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  - No anesthetic complications currently noted.   Asthma: Well-controlled.  - Restart home controller.  DVT prophylaxis: Lovenox Code Status: Full Family Communication: Husband at bedside. Disposition Plan:  Status is: Inpatient  Remains inpatient appropriate because:Inpatient level of care appropriate due to severity of illness  Dispo: The patient is from: Home              Anticipated d/c is to: Home              Patient currently is not medically stable to d/c.  Consultants:   General surgery  Anesthesia  GI  Procedures:   Sigmoidoscopy  Sigmoid colectomy  3/17  Antimicrobials:  Cefotetan per surgery   Subjective: Ambulating, no BM yet, pain is controlled, mostly around incision in abdomen less radiation to shoulders currently. Tolerating clears. Using IS.  Objective: Vitals:   11/04/20 0300 11/04/20 0500 11/04/20 0737 11/04/20 1423  BP: 130/72   140/64  Pulse: 76  90 84  Resp: 17  16 16   Temp: 98.2 F (36.8 C)   98.6 F (37 C)  TempSrc: Oral   Oral  SpO2: 98%  96% 98%  Weight:  50 kg    Height:        Intake/Output Summary (Last 24 hours) at 11/04/2020 1631 Last data filed at 11/04/2020 1508 Gross per 24 hour  Intake 2290.53 ml  Output 2025 ml  Net 265.53 ml   Filed Weights   11/03/20 0629 11/03/20 1039 11/04/20 0500  Weight: 47.3 kg 47.3 kg 50 kg   Gen: 65 y.o. female in no distress Pulm: Nonlabored breathing room air. Clear. CV: Regular rate and rhythm. No murmur, rub, or gallop. No JVD, no dependent edema. GI: Abdomen soft, appropriately tender, non-distended, with normoactive bowel sounds.  Ext: Warm, no deformities Skin: No rashes, lesions or ulcers on visualized skin. Surgical wound appears c/d/i with dried blood at inferior margin. Neuro: Alert and oriented. No focal neurological deficits. Psych: Judgement and insight appear fair. Mood euthymic & affect congruent. Behavior is appropriate.    Data Reviewed: I have personally reviewed following labs and imaging studies  CBC: Recent Labs  Lab 11/01/20 0954 11/02/20 0118 11/04/20 0311  WBC 7.7 6.4  11.9*  NEUTROABS 6.5  --   --   HGB 13.3 12.5 13.9  HCT 42.5 37.8 42.3  MCV 101.2* 98.7 97.0  PLT 260 241 253   Basic Metabolic Panel: Recent Labs  Lab 11/01/20 0954 11/02/20 0118 11/04/20 0311  NA 142 138 138  K 3.8 4.2 4.3  CL 105 109 105  CO2 27 23 26   GLUCOSE 109* 121* 132*  BUN 12 6* <5*  CREATININE 0.64 0.67 0.78  CALCIUM 8.4* 8.0* 8.5*   GFR: Estimated Creatinine Clearance: 55.3 mL/min (by C-G formula based on SCr of 0.78 mg/dL). Liver  Function Tests: Recent Labs  Lab 11/01/20 0954  AST 25  ALT 19  ALKPHOS 46  BILITOT 0.6  PROT 6.7  ALBUMIN 3.8   Recent Labs  Lab 11/01/20 0954  LIPASE 33   No results for input(s): AMMONIA in the last 168 hours. Coagulation Profile: No results for input(s): INR, PROTIME in the last 168 hours. Cardiac Enzymes: No results for input(s): CKTOTAL, CKMB, CKMBINDEX, TROPONINI in the last 168 hours. BNP (last 3 results) No results for input(s): PROBNP in the last 8760 hours. HbA1C: No results for input(s): HGBA1C in the last 72 hours. CBG: Recent Labs  Lab 11/03/20 1604 11/03/20 1758 11/03/20 2318 11/04/20 0520 11/04/20 1205  GLUCAP 140* 164* 152* 136* 120*   Lipid Profile: No results for input(s): CHOL, HDL, LDLCALC, TRIG, CHOLHDL, LDLDIRECT in the last 72 hours. Thyroid Function Tests: No results for input(s): TSH, T4TOTAL, FREET4, T3FREE, THYROIDAB in the last 72 hours. Anemia Panel: No results for input(s): VITAMINB12, FOLATE, FERRITIN, TIBC, IRON, RETICCTPCT in the last 72 hours. Urine analysis: No results found for: COLORURINE, APPEARANCEUR, LABSPEC, PHURINE, GLUCOSEU, HGBUR, BILIRUBINUR, KETONESUR, PROTEINUR, UROBILINOGEN, NITRITE, LEUKOCYTESUR Recent Results (from the past 240 hour(s))  Resp Panel by RT-PCR (Flu A&B, Covid) Nasopharyngeal Swab     Status: None   Collection Time: 11/01/20 11:38 AM   Specimen: Nasopharyngeal Swab; Nasopharyngeal(NP) swabs in vial transport medium  Result Value Ref Range Status   SARS Coronavirus 2 by RT PCR NEGATIVE NEGATIVE Final    Comment: (NOTE) SARS-CoV-2 target nucleic acids are NOT DETECTED.  The SARS-CoV-2 RNA is generally detectable in upper respiratory specimens during the acute phase of infection. The lowest concentration of SARS-CoV-2 viral copies this assay can detect is 138 copies/mL. A negative result does not preclude SARS-Cov-2 infection and should not be used as the sole basis for treatment or other patient  management decisions. A negative result may occur with  improper specimen collection/handling, submission of specimen other than nasopharyngeal swab, presence of viral mutation(s) within the areas targeted by this assay, and inadequate number of viral copies(<138 copies/mL). A negative result must be combined with clinical observations, patient history, and epidemiological information. The expected result is Negative.  Fact Sheet for Patients:  11/03/20  Fact Sheet for Healthcare Providers:  BloggerCourse.com  This test is no t yet approved or cleared by the [place] FDA and  has been authorized for detection and/or diagnosis of SARS-CoV-2 by FDA under an Emergency Use Authorization (EUA). This EUA will remain  in effect (meaning this test can be used) for the duration of the COVID-19 declaration under Section 564(b)(1) of the Act, 21 U.S.C.section 360bbb-3(b)(1), unless the authorization is terminated  or revoked sooner.       Influenza A by PCR NEGATIVE NEGATIVE Final   Influenza B by PCR NEGATIVE NEGATIVE Final    Comment: (NOTE) The Xpert Xpress SARS-CoV-2/FLU/RSV plus assay is  intended as an aid in the diagnosis of influenza from Nasopharyngeal swab specimens and should not be used as a sole basis for treatment. Nasal washings and aspirates are unacceptable for Xpert Xpress SARS-CoV-2/FLU/RSV testing.  Fact Sheet for Patients: BloggerCourse.com  Fact Sheet for Healthcare Providers: SeriousBroker.it  This test is not yet approved or cleared by the [place] FDA and has been authorized for detection and/or diagnosis of SARS-CoV-2 by FDA under an Emergency Use Authorization (EUA). This EUA will remain in effect (meaning this test can be used) for the duration of the COVID-19 declaration under Section 564(b)(1) of the Act, 21 U.S.C. section 360bbb-3(b)(1),  unless the authorization is terminated or revoked.  Performed at [facility], [address], [place], [place] [address]   Surgical pcr screen     Status: None   Collection Time: 11/01/20 11:12 PM   Specimen: Nasal Mucosa; Nasal Swab  Result Value Ref Range Status   MRSA, PCR NEGATIVE NEGATIVE Final   Staphylococcus aureus NEGATIVE NEGATIVE Final    Comment: (NOTE) The Xpert SA Assay (FDA approved for NASAL specimens in patients 54 years of age and older), is one component of a comprehensive surveillance program. It is not intended to diagnose infection nor to guide or monitor treatment. Performed at [facility], [address] [place], [place] [address]       Radiology Studies: No results found.  Scheduled Meds: . alvimopan  12 mg Oral BID  . budesonide (PULMICORT) nebulizer solution  0.25 mg Nebulization BID  . Chlorhexidine Gluconate Cloth  6 each Topical Daily  . cholecalciferol  400 Units Oral Daily  . enoxaparin (LOVENOX) injection  40 mg Subcutaneous Q24H  . multivitamin with minerals  1 tablet Oral Daily  . sodium chloride flush  3 mL Intravenous Q12H  . sodium chloride flush  3 mL Intravenous Q12H  . vitamin B-12  500 mcg Oral Daily   Continuous Infusions: . sodium chloride 250 mL (11/04/20 0852)  . dextrose 5 % and 0.45% NaCl 100 mL/hr at 11/04/20 1516     LOS: 3 days   Time spent: 25 minutes.  Tyrone Nine, MD [facility] [email or web address] 11/04/2020, 4:31 PM

## 2020-11-04 NOTE — Progress Notes (Signed)
1 Day Post-Op  Subjective: CC: Patient reports there is some soreness mainly to the right of midline incision.  She notes as tolerated with pain medication.  She is tolerating sips of clears without nausea or vomiting.  No flatus or BM.  Foley in place.  She is ambulating and walked the halls yesterday and is of the to the chair this morning.  Objective: Vital signs in last 24 hours: Temp:  [97.3 F (36.3 C)-98.6 F (37 C)] 98.2 F (36.8 C) (03/18 0300) Pulse Rate:  [61-90] 90 (03/18 0737) Resp:  [12-17] 16 (03/18 0737) BP: (129-145)/(58-77) 130/72 (03/18 0300) SpO2:  [94 %-98 %] 96 % (03/18 0737) Weight:  [47.3 kg-50 kg] 50 kg (03/18 0500) Last BM Date: 11/03/20  Intake/Output from previous day: 03/17 0701 - 03/18 0700 In: 3279.1 [P.O.:180; I.V.:2999.1; IV Piggyback:100] Out: 1800 [Urine:1800] Intake/Output this shift: No intake/output data recorded.  PE: Gen:  Alert, NAD, pleasant Card:  RRR Pulm:  CTAB, no W/R/R, effort normal Abd: Soft, ND, appropriately tender around midline incision R>L and without peritonitis, slightly hypoactive bowel sounds, midline incision with honeycomb dressing in place that is c/d/i with small area of dried blood outline. No signs of active bleeding or drainage. Laparoscopic site with gauze and tegaderm in place, c/d/i Ext:  No LE edema  Psych: A&Ox3  Skin: no rashes noted, warm and dry   Lab Results:  Recent Labs    11/02/20 0118 11/04/20 0311  WBC 6.4 11.9*  HGB 12.5 13.9  HCT 37.8 42.3  PLT 241 253   BMET Recent Labs    11/02/20 0118 11/04/20 0311  NA 138 138  K 4.2 4.3  CL 109 105  CO2 23 26  GLUCOSE 121* 132*  BUN 6* <5*  CREATININE 0.67 0.78  CALCIUM 8.0* 8.5*   PT/INR No results for input(s): LABPROT, INR in the last 72 hours. CMP     Component Value Date/Time   NA 138 11/04/2020 0311   K 4.3 11/04/2020 0311   CL 105 11/04/2020 0311   CO2 26 11/04/2020 0311   GLUCOSE 132 (H) 11/04/2020 0311   BUN <5 (L)  11/04/2020 0311   CREATININE 0.78 11/04/2020 0311   CALCIUM 8.5 (L) 11/04/2020 0311   PROT 6.7 11/01/2020 0954   ALBUMIN 3.8 11/01/2020 0954   AST 25 11/01/2020 0954   ALT 19 11/01/2020 0954   ALKPHOS 46 11/01/2020 0954   BILITOT 0.6 11/01/2020 0954   GFRNONAA >60 11/04/2020 0311   Lipase     Component Value Date/Time   LIPASE 33 11/01/2020 0954       Studies/Results: No results found.  Anti-infectives: Anti-infectives (From admission, onward)   Start     Dose/Rate Route Frequency Ordered Stop   11/03/20 0600  cefoTEtan (CEFOTAN) 2 g in sodium chloride 0.9 % 100 mL IVPB        2 g 200 mL/hr over 30 Minutes Intravenous To Surgery 11/02/20 1717 11/03/20 1121   11/02/20 1000  ceFAZolin (ANCEF) IVPB 1 g/50 mL premix  Status:  Discontinued        1 g 100 mL/hr over 30 Minutes Intravenous On call to O.R. 11/02/20 0903 11/03/20 0559       Assessment/Plan Fragile X syndrome  Sigmoid Volvulus  S/p Diagnostic Laparoscopy, sigmoid colectomy - Dr. Carolynne Edouard - 3/17  POD #1 - Continue enterg till has ROBF - Okay for cld - Continue to mobilize - D/c Foley  - Pulm toilet  FEN -  CLD, IVF per [facility] VTE - SCDs, okay for chemical prophylaxis from a general surgery standpoint ID - Cefotetan periop. None currently Foley - D/c today Follow-up - Dr. Carolynne Edouard   LOS: 3 days    Jacinto Halim , [facility] Surgery 11/04/2020, 9:12 AM Please see Amion for pager number during day hours 7:00am-4:30pm

## 2020-11-05 MED ORDER — FAMOTIDINE 10 MG PO TABS
10.0000 mg | ORAL_TABLET | Freq: Every day | ORAL | Status: DC
  Administered 2020-11-05 – 2020-11-06 (×2): 10 mg via ORAL
  Filled 2020-11-05 (×2): qty 1

## 2020-11-05 MED ORDER — PANTOPRAZOLE SODIUM 40 MG PO TBEC: 40.0000 mg | DELAYED_RELEASE_TABLET | Freq: Every day | ORAL | Status: DC

## 2020-11-05 MED ORDER — PANTOPRAZOLE SODIUM 40 MG PO TBEC
40.0000 mg | DELAYED_RELEASE_TABLET | Freq: Every day | ORAL | Status: DC
  Administered 2020-11-06 – 2020-11-07 (×2): 40 mg via ORAL
  Filled 2020-11-05 (×2): qty 1

## 2020-11-05 NOTE — Progress Notes (Signed)
PROGRESS NOTE  Meredith Walter  XAJ:[id number] DOB: 08-10-1956 DOA: 11/01/2020 PCP: Patient, No Pcp Per   Brief Narrative: Meredith Walter is a 65 y.o. female with a history of fragile X syndrome who was traveling to [place] from her home in [place] by motor home with her husband when she developed abdominal pain and distention in [place], [place] prompting evaluation in the ED. She was found to have sigmoid volvulus which was reduced endoscopically. She was transferred to [facility] for sigmoid colectomy on 3/17 under general anesthesia (no inhalational anesthesia was used to reduce risk of complications due to fragile X syndrome).   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  - Surgery performed sigmoid colectomy 3/17.  - CLD > FLD per surgery - Incentive spirometry - Appreciate their management of pain control, wound care.  - Restart VTE ppx per their ok.  Constipation post-op:  - Continue entereg and FLD pending ROBF.  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  - No anesthetic complications currently noted.   Asthma: Well-controlled.  - Restarted home controller.  GERD:  - Restart home acid suppression medications.  DVT prophylaxis: Lovenox Code Status: Full Family Communication: Husband at bedside. Disposition Plan:  Status is: Inpatient  Remains inpatient appropriate because:Inpatient level of care appropriate due to severity of illness  Dispo: The patient is from: Home              Anticipated d/c is to: Home - it appears the patient may just follow up with [facility] prior to returning to [place]. Will continue discussions  closer to discharge.              Patient currently is not medically stable to d/c.  Consultants:   General surgery  Anesthesia  GI  Procedures:   Sigmoidoscopy  Sigmoid colectomy 3/17  Antimicrobials:  Cefotetan per surgery   Subjective: Walking in the halls, pain is tolerable, wants to use oxycodone less to allow ROBF. +flatus, no BM. Voiding, eating clears, grits this AM.  Objective: Vitals:   11/04/20 1423 11/04/20 2029 11/05/20 0442 11/05/20 1225  BP: 140/64 (!) 143/66 (!) 126/57 121/63  Pulse: 84 71 70 78  Resp: 16 17 18 19   Temp: 98.6 F (37 C) 98.1 F (36.7 C) 98.1 F (36.7 C) 98.1 F (36.7 C)  TempSrc: Oral Oral Oral   SpO2: 98% 99% 97% 98%  Weight:      Height:        Intake/Output Summary (Last 24 hours) at 11/05/2020 1718 Last data filed at 11/05/2020 0756 Gross per 24 hour  Intake 1100 ml  Output --  Net 1100 ml   Filed Weights   11/03/20 0629 11/03/20 1039 11/04/20 0500  Weight: 47.3 kg 47.3 kg 50 kg   Gen: 65 y.o. female in no distress Pulm: Nonlabored breathing room air. Clear. CV: Regular rate and rhythm. No murmur, rub, or gallop. No JVD, no dependent edema. GI: Abdomen soft, appropriately tender, non-distended, with normoactive bowel sounds.  Ext: Warm, no deformities Skin: Midline abdominal wound remains c/d/i with honeycomb dressing and no exudate or bleeding. No other rashes, lesions or ulcers on visualized skin. Neuro: Alert and oriented.  No focal neurological deficits. Psych: Judgement and insight appear fair. Mood euthymic & affect congruent. Behavior is appropriate.    Data Reviewed: I have personally reviewed following labs and imaging studies  CBC: Recent Labs  Lab 11/01/20 0954 11/02/20 0118 11/04/20 0311  WBC 7.7 6.4 11.9*  NEUTROABS 6.5  --   --   HGB 13.3 12.5 13.9  HCT 42.5 37.8 42.3  MCV 101.2* 98.7 97.0  PLT 260 241 253   Basic Metabolic Panel: Recent Labs  Lab 11/01/20 0954 11/02/20 0118 11/04/20 0311   NA 142 138 138  K 3.8 4.2 4.3  CL 105 109 105  CO2 27 23 26   GLUCOSE 109* 121* 132*  BUN 12 6* <5*  CREATININE 0.64 0.67 0.78  CALCIUM 8.4* 8.0* 8.5*   GFR: Estimated Creatinine Clearance: 55.3 mL/min (by C-G formula based on SCr of 0.78 mg/dL). Liver Function Tests: Recent Labs  Lab 11/01/20 0954  AST 25  ALT 19  ALKPHOS 46  BILITOT 0.6  PROT 6.7  ALBUMIN 3.8   Recent Labs  Lab 11/01/20 0954  LIPASE 33   No results for input(s): AMMONIA in the last 168 hours. Coagulation Profile: No results for input(s): INR, PROTIME in the last 168 hours. Cardiac Enzymes: No results for input(s): CKTOTAL, CKMB, CKMBINDEX, TROPONINI in the last 168 hours. BNP (last 3 results) No results for input(s): PROBNP in the last 8760 hours. HbA1C: No results for input(s): HGBA1C in the last 72 hours. CBG: Recent Labs  Lab 11/03/20 1604 11/03/20 1758 11/03/20 2318 11/04/20 0520 11/04/20 1205  GLUCAP 140* 164* 152* 136* 120*   Lipid Profile: No results for input(s): CHOL, HDL, LDLCALC, TRIG, CHOLHDL, LDLDIRECT in the last 72 hours. Thyroid Function Tests: No results for input(s): TSH, T4TOTAL, FREET4, T3FREE, THYROIDAB in the last 72 hours. Anemia Panel: No results for input(s): VITAMINB12, FOLATE, FERRITIN, TIBC, IRON, RETICCTPCT in the last 72 hours. Urine analysis: No results found for: COLORURINE, APPEARANCEUR, LABSPEC, PHURINE, GLUCOSEU, HGBUR, BILIRUBINUR, KETONESUR, PROTEINUR, UROBILINOGEN, NITRITE, LEUKOCYTESUR Recent Results (from the past 240 hour(s))  Resp Panel by RT-PCR (Flu A&B, Covid) Nasopharyngeal Swab     Status: None   Collection Time: 11/01/20 11:38 AM   Specimen: Nasopharyngeal Swab; Nasopharyngeal(NP) swabs in vial transport medium  Result Value Ref Range Status   SARS Coronavirus 2 by RT PCR NEGATIVE NEGATIVE Final    Comment: (NOTE) SARS-CoV-2 target nucleic acids are NOT DETECTED.  The SARS-CoV-2 RNA is generally detectable in upper respiratory specimens  during the acute phase of infection. The lowest concentration of SARS-CoV-2 viral copies this assay can detect is 138 copies/mL. A negative result does not preclude SARS-Cov-2 infection and should not be used as the sole basis for treatment or other patient management decisions. A negative result may occur with  improper specimen collection/handling, submission of specimen other than nasopharyngeal swab, presence of viral mutation(s) within the areas targeted by this assay, and inadequate number of viral copies(<138 copies/mL). A negative result must be combined with clinical observations, patient history, and epidemiological information. The expected result is Negative.  Fact Sheet for Patients:  11/03/20  Fact Sheet for Healthcare Providers:  BloggerCourse.com  This test is no t yet approved or cleared by the [place] FDA and  has been authorized for detection and/or diagnosis of SARS-CoV-2 by FDA under an Emergency Use Authorization (EUA). This EUA will remain  in effect (meaning this test can be used) for the duration of the COVID-19 declaration under Section 564(b)(1) of  the Act, 21 U.S.C.section 360bbb-3(b)(1), unless the authorization is terminated  or revoked sooner.       Influenza A by PCR NEGATIVE NEGATIVE Final   Influenza B by PCR NEGATIVE NEGATIVE Final    Comment: (NOTE) The Xpert Xpress SARS-CoV-2/FLU/RSV plus assay is intended as an aid in the diagnosis of influenza from Nasopharyngeal swab specimens and should not be used as a sole basis for treatment. Nasal washings and aspirates are unacceptable for Xpert Xpress SARS-CoV-2/FLU/RSV testing.  Fact Sheet for Patients: BloggerCourse.com  Fact Sheet for Healthcare Providers: SeriousBroker.it  This test is not yet approved or cleared by the [place] FDA and has been authorized for detection  and/or diagnosis of SARS-CoV-2 by FDA under an Emergency Use Authorization (EUA). This EUA will remain in effect (meaning this test can be used) for the duration of the COVID-19 declaration under Section 564(b)(1) of the Act, 21 U.S.C. section 360bbb-3(b)(1), unless the authorization is terminated or revoked.  Performed at [facility], [address], [place], [place] [address]   Surgical pcr screen     Status: None   Collection Time: 11/01/20 11:12 PM   Specimen: Nasal Mucosa; Nasal Swab  Result Value Ref Range Status   MRSA, PCR NEGATIVE NEGATIVE Final   Staphylococcus aureus NEGATIVE NEGATIVE Final    Comment: (NOTE) The Xpert SA Assay (FDA approved for NASAL specimens in patients 61 years of age and older), is one component of a comprehensive surveillance program. It is not intended to diagnose infection nor to guide or monitor treatment. Performed at [facility], [address] [place], [place] [address]       Radiology Studies: No results found.  Scheduled Meds: . alvimopan  12 mg Oral BID  . budesonide (PULMICORT) nebulizer solution  0.25 mg Nebulization BID  . Chlorhexidine Gluconate Cloth  6 each Topical Daily  . cholecalciferol  400 Units Oral Daily  . enoxaparin (LOVENOX) injection  40 mg Subcutaneous Q24H  . famotidine  10 mg Oral QHS  . multivitamin with minerals  1 tablet Oral Daily  . [START ON 11/06/2020] pantoprazole  40 mg Oral Daily  . sodium chloride flush  3 mL Intravenous Q12H  . sodium chloride flush  3 mL Intravenous Q12H  . vitamin B-12  500 mcg Oral Daily   Continuous Infusions: . sodium chloride 250 mL (11/04/20 0852)     LOS: 4 days   Time spent: 25 minutes.  Tyrone Nine, MD [facility] [email or web address] 11/05/2020, 5:18 PM

## 2020-11-05 NOTE — Progress Notes (Signed)
2 Days Post-Op  Subjective: CC: Patient reports that she has some abdominal soreness around her midline incision.  Feels Tylenol helps the most.  She denies any nausea or vomiting.  She is tolerating clear liquids.  Denies flatus or BM.  Mobilizing without difficulty.  Voiding after foley removal.   Objective: Vital signs in last 24 hours: Temp:  [98.1 F (36.7 C)-98.6 F (37 C)] 98.1 F (36.7 C) (03/19 0442) Pulse Rate:  [70-84] 70 (03/19 0442) Resp:  [16-18] 18 (03/19 0442) BP: (126-143)/(57-66) 126/57 (03/19 0442) SpO2:  [97 %-99 %] 97 % (03/19 0442) Last BM Date: 11/03/20  Intake/Output from previous day: 03/18 0701 - 03/19 0700 In: 1603.3 [P.O.:1220; I.V.:383.3] Out: 475 [Urine:475] Intake/Output this shift: Total I/O In: 360 [P.O.:360] Out: -   PE: Gen:  Alert, NAD, pleasant Card:  RRR Pulm:  CTAB, no W/R/R, effort normal Abd: Soft, ND, appropriately tender around midline incision and without peritonitis, slightly hypoactive bowel sounds, midline incision with honeycomb dressing in place that is c/d/i with small area of dried blood outline that is stable from yesterday. No signs of active bleeding or drainage. Laparoscopic site with gauze and tegaderm in place, c/d/i Ext:  No LE edema  Psych: A&Ox3  Skin: no rashes noted, warm and dry  Lab Results:  Recent Labs    11/04/20 0311  WBC 11.9*  HGB 13.9  HCT 42.3  PLT 253   BMET Recent Labs    11/04/20 0311  NA 138  K 4.3  CL 105  CO2 26  GLUCOSE 132*  BUN <5*  CREATININE 0.78  CALCIUM 8.5*   PT/INR No results for input(s): LABPROT, INR in the last 72 hours. CMP     Component Value Date/Time   NA 138 11/04/2020 0311   K 4.3 11/04/2020 0311   CL 105 11/04/2020 0311   CO2 26 11/04/2020 0311   GLUCOSE 132 (H) 11/04/2020 0311   BUN <5 (L) 11/04/2020 0311   CREATININE 0.78 11/04/2020 0311   CALCIUM 8.5 (L) 11/04/2020 0311   PROT 6.7 11/01/2020 0954   ALBUMIN 3.8 11/01/2020 0954   AST 25  11/01/2020 0954   ALT 19 11/01/2020 0954   ALKPHOS 46 11/01/2020 0954   BILITOT 0.6 11/01/2020 0954   GFRNONAA >60 11/04/2020 0311   Lipase     Component Value Date/Time   LIPASE 33 11/01/2020 0954       Studies/Results: No results found.  Anti-infectives: Anti-infectives (From admission, onward)   Start     Dose/Rate Route Frequency Ordered Stop   11/03/20 0600  cefoTEtan (CEFOTAN) 2 g in sodium chloride 0.9 % 100 mL IVPB        2 g 200 mL/hr over 30 Minutes Intravenous To Surgery 11/02/20 1717 11/03/20 1121   11/02/20 1000  ceFAZolin (ANCEF) IVPB 1 g/50 mL premix  Status:  Discontinued        1 g 100 mL/hr over 30 Minutes Intravenous On call to O.R. 11/02/20 0903 11/03/20 0559       Assessment/Plan Fragile X syndrome  SigmoidVolvulus S/p Diagnostic Laparoscopy, sigmoid colectomy - Dr. Carolynne Edouard - 3/17  POD #2 - Continue enterg till has ROBF - Okay for FLD. Keep on FLD till has ROBF - Path as below - Continue to mobilize - Pulm toilet  FEN - FLD, IVF per [facility] VTE - SCDs, okay for chemical prophylaxis from a general surgery standpoint ID - Cefotetan periop. None currently Foley - None  Follow-up - Dr.  Toth Path - COLON, SIGMOID, COLECTOMY:  - Sessile tubular adenoma, 1.6 cm.  - No high-grade dysplasia or malignancy.  - Subserosal hyperemia, clinically sigmoid volvulus.     LOS: 4 days    Jacinto Halim , [facility] Surgery 11/05/2020, 8:53 AM Please see Amion for pager number during day hours 7:00am-4:30pm

## 2020-11-06 NOTE — Progress Notes (Signed)
PROGRESS NOTE  Meredith Walter  PJK:[id number] DOB: 1955/11/27 DOA: 11/01/2020 PCP: Patient, No Pcp Per   Brief Narrative: Meredith Walter is a 65 y.o. female with a history of fragile X syndrome who was traveling to [place] from her home in [place] by motor home with her husband when she developed abdominal pain and distention in [place], [place] prompting evaluation in the ED. She was found to have sigmoid volvulus which was reduced endoscopically. She was transferred to [facility] for sigmoid colectomy on 3/17 under general anesthesia (no inhalational anesthesia was used to reduce risk of complications due to fragile X syndrome).   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  - Surgery performed sigmoid colectomy 3/17. Will follow their postoperative recommendations. Pt to f/u at [facility] after DC. - Incentive spirometry  Constipation post-op:  - Improving.  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  - No anesthetic complications currently noted.   Asthma: Well-controlled.  - Continue Tx  GERD:  - Continue home acid suppression medications.  DVT prophylaxis: Lovenox Code Status: Full Family Communication: Husband at bedside. Disposition Plan:  Status is: Inpatient  Remains inpatient appropriate because:Inpatient level of care appropriate due to severity of illness  Dispo: The patient is from: Home              Anticipated d/c is to: Home - it appears the patient may just follow up with [facility] prior to returning to [place].               Patient currently is not medically stable to d/c.  Consultants:   General  surgery  Anesthesia  GI  Procedures:   Sigmoidoscopy  Sigmoid colectomy 3/17  Antimicrobials:  Cefotetan per surgery   Subjective: Some BMs which required some straining and painless admixed red blood which is not ongoing. Using IS, eating grits, planning to f/u w/[facility].  Objective: Vitals:   11/05/20 1930 11/06/20 0455 11/06/20 0945 11/06/20 1449  BP: 136/67 124/70  122/63  Pulse: 83 75 (!) 104 76  Resp: 18 18 18 16   Temp: 98.3 F (36.8 C) 98.2 F (36.8 C)  98.2 F (36.8 C)  TempSrc:  Oral    SpO2: 100% 99% 95% 98%  Weight:      Height:        Intake/Output Summary (Last 24 hours) at 11/06/2020 1526 Last data filed at 11/06/2020 1200 Gross per 24 hour  Intake 1203 ml  Output --  Net 1203 ml   Filed Weights   11/03/20 0629 11/03/20 1039 11/04/20 0500  Weight: 47.3 kg 47.3 kg 50 kg   Gen: 65 y.o. female in no distress Pulm: Nonlabored breathing room air. Clear. CV: Regular rate and rhythm. No murmur, rub, or gallop. No JVD, no dependent edema. GI: Abdomen soft, appropriately tender, non-distended, with normoactive bowel sounds.  Ext: Warm, no deformities Skin: No new rashes, lesions or ulcers on visualized skin. Midline incision c/d/i. Neuro: Alert and oriented. No focal neurological deficits. Psych: Judgement and insight appear fair. Mood euthymic & affect congruent. Behavior is appropriate.    Data Reviewed: I have personally reviewed following labs and imaging studies  CBC: Recent Labs  Lab  11/01/20 0954 11/02/20 0118 11/04/20 0311  WBC 7.7 6.4 11.9*  NEUTROABS 6.5  --   --   HGB 13.3 12.5 13.9  HCT 42.5 37.8 42.3  MCV 101.2* 98.7 97.0  PLT 260 241 253   Basic Metabolic Panel: Recent Labs  Lab 11/01/20 0954 11/02/20 0118 11/04/20 0311  NA 142 138 138  K 3.8 4.2 4.3  CL 105 109 105  CO2 27 23 26   GLUCOSE 109* 121* 132*  BUN 12 6* <5*  CREATININE 0.64 0.67 0.78  CALCIUM 8.4* 8.0* 8.5*   GFR: Estimated Creatinine Clearance: 55.3 mL/min  (by C-G formula based on SCr of 0.78 mg/dL). Liver Function Tests: Recent Labs  Lab 11/01/20 0954  AST 25  ALT 19  ALKPHOS 46  BILITOT 0.6  PROT 6.7  ALBUMIN 3.8   Recent Labs  Lab 11/01/20 0954  LIPASE 33   No results for input(s): AMMONIA in the last 168 hours. Coagulation Profile: No results for input(s): INR, PROTIME in the last 168 hours. Cardiac Enzymes: No results for input(s): CKTOTAL, CKMB, CKMBINDEX, TROPONINI in the last 168 hours. BNP (last 3 results) No results for input(s): PROBNP in the last 8760 hours. HbA1C: No results for input(s): HGBA1C in the last 72 hours. CBG: Recent Labs  Lab 11/03/20 1604 11/03/20 1758 11/03/20 2318 11/04/20 0520 11/04/20 1205  GLUCAP 140* 164* 152* 136* 120*   Lipid Profile: No results for input(s): CHOL, HDL, LDLCALC, TRIG, CHOLHDL, LDLDIRECT in the last 72 hours. Thyroid Function Tests: No results for input(s): TSH, T4TOTAL, FREET4, T3FREE, THYROIDAB in the last 72 hours. Anemia Panel: No results for input(s): VITAMINB12, FOLATE, FERRITIN, TIBC, IRON, RETICCTPCT in the last 72 hours. Urine analysis: No results found for: COLORURINE, APPEARANCEUR, LABSPEC, PHURINE, GLUCOSEU, HGBUR, BILIRUBINUR, KETONESUR, PROTEINUR, UROBILINOGEN, NITRITE, LEUKOCYTESUR Recent Results (from the past 240 hour(s))  Resp Panel by RT-PCR (Flu A&B, Covid) Nasopharyngeal Swab     Status: None   Collection Time: 11/01/20 11:38 AM   Specimen: Nasopharyngeal Swab; Nasopharyngeal(NP) swabs in vial transport medium  Result Value Ref Range Status   SARS Coronavirus 2 by RT PCR NEGATIVE NEGATIVE Final    Comment: (NOTE) SARS-CoV-2 target nucleic acids are NOT DETECTED.  The SARS-CoV-2 RNA is generally detectable in upper respiratory specimens during the acute phase of infection. The lowest concentration of SARS-CoV-2 viral copies this assay can detect is 138 copies/mL. A negative result does not preclude SARS-Cov-2 infection and should not be used  as the sole basis for treatment or other patient management decisions. A negative result may occur with  improper specimen collection/handling, submission of specimen other than nasopharyngeal swab, presence of viral mutation(s) within the areas targeted by this assay, and inadequate number of viral copies(<138 copies/mL). A negative result must be combined with clinical observations, patient history, and epidemiological information. The expected result is Negative.  Fact Sheet for Patients:  11/03/20  Fact Sheet for Healthcare Providers:  BloggerCourse.com  This test is no t yet approved or cleared by the [place] FDA and  has been authorized for detection and/or diagnosis of SARS-CoV-2 by FDA under an Emergency Use Authorization (EUA). This EUA will remain  in effect (meaning this test can be used) for the duration of the COVID-19 declaration under Section 564(b)(1) of the Act, 21 U.S.C.section 360bbb-3(b)(1), unless the authorization is terminated  or revoked sooner.       Influenza A by PCR NEGATIVE NEGATIVE Final   Influenza B by PCR NEGATIVE NEGATIVE Final  Comment: (NOTE) The Xpert Xpress SARS-CoV-2/FLU/RSV plus assay is intended as an aid in the diagnosis of influenza from Nasopharyngeal swab specimens and should not be used as a sole basis for treatment. Nasal washings and aspirates are unacceptable for Xpert Xpress SARS-CoV-2/FLU/RSV testing.  Fact Sheet for Patients: BloggerCourse.com  Fact Sheet for Healthcare Providers: SeriousBroker.it  This test is not yet approved or cleared by the [place] FDA and has been authorized for detection and/or diagnosis of SARS-CoV-2 by FDA under an Emergency Use Authorization (EUA). This EUA will remain in effect (meaning this test can be used) for the duration of the COVID-19 declaration under Section 564(b)(1)  of the Act, 21 U.S.C. section 360bbb-3(b)(1), unless the authorization is terminated or revoked.  Performed at [facility], [address], [place], [place] [address]   Surgical pcr screen     Status: None   Collection Time: 11/01/20 11:12 PM   Specimen: Nasal Mucosa; Nasal Swab  Result Value Ref Range Status   MRSA, PCR NEGATIVE NEGATIVE Final   Staphylococcus aureus NEGATIVE NEGATIVE Final    Comment: (NOTE) The Xpert SA Assay (FDA approved for NASAL specimens in patients 83 years of age and older), is one component of a comprehensive surveillance program. It is not intended to diagnose infection nor to guide or monitor treatment. Performed at [facility], [address] [place], [place] [address]       Radiology Studies: No results found.  Scheduled Meds: . alvimopan  12 mg Oral BID  . budesonide (PULMICORT) nebulizer solution  0.25 mg Nebulization BID  . Chlorhexidine Gluconate Cloth  6 each Topical Daily  . cholecalciferol  400 Units Oral Daily  . enoxaparin (LOVENOX) injection  40 mg Subcutaneous Q24H  . famotidine  10 mg Oral QHS  . multivitamin with minerals  1 tablet Oral Daily  . pantoprazole  40 mg Oral Daily  . sodium chloride flush  3 mL Intravenous Q12H  . sodium chloride flush  3 mL Intravenous Q12H  . vitamin B-12  500 mcg Oral Daily   Continuous Infusions: . sodium chloride 250 mL (11/04/20 0852)     LOS: 5 days   Time spent: 25 minutes.  Tyrone Nine, MD [facility] [email or web address] 11/06/2020, 3:26 PM

## 2020-11-06 NOTE — Progress Notes (Signed)
3 Days Post-Op  Subjective: CC: Patient reports she is doing well.  Some pain around the midline incision that is well controlled with Tylenol.  Tolerating full liquid diet without nausea or vomiting.  Had her first episode of flatus 20 minutes ago.  No BM.  Mobilizing without difficulty.  Voiding.  Objective: Vital signs in last 24 hours: Temp:  [98.1 F (36.7 C)-98.3 F (36.8 C)] 98.2 F (36.8 C) (03/20 0455) Pulse Rate:  [75-83] 75 (03/20 0455) Resp:  [18-19] 18 (03/20 0455) BP: (121-136)/(63-70) 124/70 (03/20 0455) SpO2:  [98 %-100 %] 99 % (03/20 0455) Last BM Date: 11/03/20  Intake/Output from previous day: 03/19 0701 - 03/20 0700 In: 720 [P.O.:720] Out: -  Intake/Output this shift: Total I/O In: 3 [I.V.:3] Out: -   PE: Gen: Alert, NAD, pleasant Card: RRR Pulm: CTAB, no W/R/R, effort normal Abd: Soft,ND, appropriately tender around midline incision and without peritonitis,slightly hypoactive bowel sounds, midline incision with honeycomb dressing in place that is c/d/i with small area of dried blood outline that is stable from yesterday. No signs of active bleeding or drainage. Laparoscopic site with gauze and tegaderm in place, c/d/i Ext: No LE edema Psych: A&Ox3  Skin: no rashes noted, warm and dry  Lab Results:  Recent Labs    11/04/20 0311  WBC 11.9*  HGB 13.9  HCT 42.3  PLT 253   BMET Recent Labs    11/04/20 0311  NA 138  K 4.3  CL 105  CO2 26  GLUCOSE 132*  BUN <5*  CREATININE 0.78  CALCIUM 8.5*   PT/INR No results for input(s): LABPROT, INR in the last 72 hours. CMP     Component Value Date/Time   NA 138 11/04/2020 0311   K 4.3 11/04/2020 0311   CL 105 11/04/2020 0311   CO2 26 11/04/2020 0311   GLUCOSE 132 (H) 11/04/2020 0311   BUN <5 (L) 11/04/2020 0311   CREATININE 0.78 11/04/2020 0311   CALCIUM 8.5 (L) 11/04/2020 0311   PROT 6.7 11/01/2020 0954   ALBUMIN 3.8 11/01/2020 0954   AST 25 11/01/2020 0954   ALT 19 11/01/2020  0954   ALKPHOS 46 11/01/2020 0954   BILITOT 0.6 11/01/2020 0954   GFRNONAA >60 11/04/2020 0311   Lipase     Component Value Date/Time   LIPASE 33 11/01/2020 0954       Studies/Results: No results found.  Anti-infectives: Anti-infectives (From admission, onward)   Start     Dose/Rate Route Frequency Ordered Stop   11/03/20 0600  cefoTEtan (CEFOTAN) 2 g in sodium chloride 0.9 % 100 mL IVPB        2 g 200 mL/hr over 30 Minutes Intravenous To Surgery 11/02/20 1717 11/03/20 1121   11/02/20 1000  ceFAZolin (ANCEF) IVPB 1 g/50 mL premix  Status:  Discontinued        1 g 100 mL/hr over 30 Minutes Intravenous On call to O.R. 11/02/20 0903 11/03/20 0559       Assessment/Plan Fragile X syndrome  SigmoidVolvulus S/p Diagnostic Laparoscopy, sigmoid colectomy - Dr. Carolynne Edouard - 3/17  POD #3 - Continue enterg till has ROBF - Keep on FLD till has ROBF. Just had first episode of flatus this am - Path as below - Continue to mobilize - Pulm toilet  FEN -FLD, IVF per [facility] VTE -SCDs, Lovenox  ID -Cefotetan periop. None currently Foley - None  Follow-up- Dr. Carolynne Edouard Path - COLON, SIGMOID, COLECTOMY:  - Sessile tubular adenoma, 1.6 cm.  -  No high-grade dysplasia or malignancy.  - Subserosal hyperemia, clinically sigmoid volvulus.    LOS: 5 days    Jacinto Halim , [facility] 11/06/2020, 8:53 AM Please see Amion for pager number during day hours 7:00am-4:30pm

## 2020-11-06 NOTE — Plan of Care (Signed)
  Problem: Education: Goal: Knowledge of General Education information will improve Description: Including pain rating scale, medication(s)/side effects and non-pharmacologic comfort measures Outcome: Progressing   Problem: Health Behavior/Discharge Planning: Goal: Ability to manage health-related needs will improve Outcome: Progressing   Problem: Clinical Measurements: Goal: Ability to maintain clinical measurements within normal limits will improve Outcome: Progressing   Problem: Activity: Goal: Risk for activity intolerance will decrease Outcome: Progressing   Problem: Nutrition: Goal: Adequate nutrition will be maintained Outcome: Progressing   Problem: Pain Managment: Goal: General experience of comfort will improve Outcome: Progressing   Problem: Safety: Goal: Ability to remain free from injury will improve Outcome: Progressing   Problem: Skin Integrity: Goal: Risk for impaired skin integrity will decrease Outcome: Progressing   Problem: Clinical Measurements: Goal: Postoperative complications will be avoided or minimized Outcome: Progressing   

## 2020-11-07 ENCOUNTER — Other Ambulatory Visit: Payer: Self-pay | Admitting: Physician Assistant

## 2020-11-07 DIAGNOSIS — K5900 Constipation, unspecified: Secondary | ICD-10-CM

## 2020-11-07 DIAGNOSIS — J4520 Mild intermittent asthma, uncomplicated: Secondary | ICD-10-CM

## 2020-11-07 MED ORDER — POLYETHYLENE GLYCOL 3350 17 G PO PACK: 17.0000 g | PACK | Freq: Every day | ORAL | 0 refills | Status: AC | PRN

## 2020-11-07 MED ORDER — OXYCODONE HCL 5 MG PO TABS: 5.0000 mg | ORAL_TABLET | Freq: Four times a day (QID) | ORAL | 0 refills | Status: AC | PRN

## 2020-11-07 MED ORDER — ACETAMINOPHEN 325 MG PO TABS: 650.0000 mg | ORAL_TABLET | Freq: Four times a day (QID) | ORAL | 0 refills | Status: AC | PRN

## 2020-11-07 MED ORDER — DOCUSATE SODIUM 100 MG PO CAPS: 100.0000 mg | ORAL_CAPSULE | Freq: Two times a day (BID) | ORAL | 2 refills | Status: AC | PRN

## 2020-11-07 MED FILL — oxyCODONE HCL 5 MG TABS: 5 | 4 days supply | Qty: 15 | Fill #0

## 2020-11-07 NOTE — Discharge Summary (Signed)
Patient ID: Meredith Walter [id number] 10/13/1955 65 y.o.  Admit date: 11/01/2020 Discharge date: 11/07/2020  Admitting Diagnosis: Volvulus of the sigmoid colon Fragile X syndrome   Discharge Diagnosis Patient Active Problem List   Diagnosis Date Noted  . Volvulus of sigmoid colon (HCC) 11/01/2020  . 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 Chronic constipation in female 11/01/2020  . Asthma 11/01/2020  . Sigmoid volvulus [facility])     Consultants General surgery GI [facility]  Procedures Dr. Marguerita Merles - Flexible Sigmoidoscopy - 11/01/2020  Dr. Carolynne Edouard - Diagnostic Laparoscopy, Sigmoid colectomy - 11/03/2020  Hospital Course:  Patient presented to [facility] ED on 3/5 with abdominal pain and was found to have a sigmoid volvulus. [facility] admitted and GI was consulted. Patient underwent successful decompression of sigmoid volvulus by gastroenterology on 3/15.  Patient has a history of fragile X syndrome and does not tolerate inhalation anesthetics.  The anesthesia team at [facility] was not comfortable putting her to sleep for a sigmoid colectomy, so she was transferred to [facility] under [facility] service. On arrival, our team was consulted. Patient underwent Diagnostic Laparoscopy, Sigmoid colectomy by Dr. Carolynne Edouard on 3/17. Patient tolerated the procedure well and was transferred back to the floor. General surgery took over as primary post op. Patients diet was advanced and tolerated. On POD 4, the patient was voiding well, had return of bowel functioning, was tolerating diet, ambulating well, pain well controlled, vital signs stable, incisions c/d/i and felt stable for discharge home. [place] but is willing to stay in the area till follow up on 3/29. We recommended contacting her PCP when she gets back home as well for a referral to general surgery. Return precautions were discussed.     Allergies as of 11/07/2020      Reactions   Dairycare [lactase-lactobacillus]       Medication List    TAKE these medications   acetaminophen 325 MG tablet Commonly known as: TYLENOL Take 2 tablets (650 mg total) by mouth every 6 (six) hours as needed.   docusate sodium 100 MG capsule Commonly known as: Colace Take 1 capsule (100 mg total) by mouth 2 (two) times daily as needed for mild constipation.   lubiprostone 8 MCG capsule Commonly known as: AMITIZA Take 8 mcg by mouth 2 (two) times daily.   multivitamin tablet Take 1 tablet by mouth daily.   omeprazole 20 MG capsule Commonly known as: PRILOSEC Take 20 mg by mouth daily.   oxyCODONE 5 MG immediate release tablet Commonly known as: Oxy IR/ROXICODONE Take 1 tablet (5 mg total) by mouth every 6 (six) hours as needed for breakthrough pain.   polyethylene glycol 17 g packet Commonly known as: MIRALAX / GLYCOLAX Take 17 g by mouth daily as needed for mild constipation.   Qvar RediHaler 80 MCG/ACT inhaler Generic drug: beclomethasone Inhale 1-2 puffs into the lungs in the morning and at bedtime.   risedronate 150 MG tablet Commonly known as: ACTONEL Take 150 mg by mouth every 30 (thirty) days.   vitamin B-12 500 MCG tablet Commonly known as: CYANOCOBALAMIN Take 500 mcg by mouth daily.   VITAMIN D PO Take 1 tablet by mouth daily.        Follow-up Information    Surgery, [facility]. Go on 11/15/2020.   Specialty: General Surgery Why: 845am for follow up and staple removal. Please arrive 30 minutes  prior to your appointment for paperwork. Please bring a copy of your photo ID and insurance card. Contact information: [address] [place] [address] [phone]        [address],D.O. Follow up.   Why: Please call to schedule a follow up appointment and get a referral to a general surgeon in your area.  Contact information: [facility] [address] [place], [place]  [address] [phone]              Signed: Leary Roca, [facility] 11/07/2020, 9:31 AM Please see Amion for pager number during day hours 7:00am-4:30pm

## 2020-11-07 NOTE — Progress Notes (Signed)
4 Days Post-Op  Subjective: CC: Doing well.  Having very mild pain around her incision that is well controlled with Tylenol.  Has not required any oxycodone for the last several days.  She is tolerating a soft diet without any nausea or vomiting.  Had a BM yesterday.  Continues to pass flatus.  Mobilizing in halls.  Voiding.  Objective: Vital signs in last 24 hours: Temp:  [97.7 F (36.5 C)-98.2 F (36.8 C)] 97.7 F (36.5 C) (03/21 0425) Pulse Rate:  [66-104] 74 (03/21 0721) Resp:  [16-18] 18 (03/21 0721) BP: (115-132)/(54-65) 132/65 (03/21 0425) SpO2:  [95 %-100 %] 96 % (03/21 0721) Last BM Date: 11/05/20 (according to pt(small))  Intake/Output from previous day: 03/20 0701 - 03/21 0700 In: 1083 [P.O.:1080; I.V.:3] Out: -  Intake/Output this shift: No intake/output data recorded.  PE: Gen: Alert, NAD, pleasant Card: RRR Pulm: CTAB, no W/R/R, effort normal Abd: Soft,ND, appropriately tender around midline incision and without peritonitis,+ bowel sounds, midline incision c/d/i with staples in place. No signs of active bleeding or drainage. Laparoscopic site with staples in place, c/d/i Ext: No LE edema Psych: A&Ox3  Skin: no rashes noted, warm and dry  Lab Results:  No results for input(s): WBC, HGB, HCT, PLT in the last 72 hours. BMET No results for input(s): NA, K, CL, CO2, GLUCOSE, BUN, CREATININE, CALCIUM in the last 72 hours. PT/INR No results for input(s): LABPROT, INR in the last 72 hours. CMP     Component Value Date/Time   NA 138 11/04/2020 0311   K 4.3 11/04/2020 0311   CL 105 11/04/2020 0311   CO2 26 11/04/2020 0311   GLUCOSE 132 (H) 11/04/2020 0311   BUN <5 (L) 11/04/2020 0311   CREATININE 0.78 11/04/2020 0311   CALCIUM 8.5 (L) 11/04/2020 0311   PROT 6.7 11/01/2020 0954   ALBUMIN 3.8 11/01/2020 0954   AST 25 11/01/2020 0954   ALT 19 11/01/2020 0954   ALKPHOS 46 11/01/2020 0954   BILITOT 0.6 11/01/2020 0954   GFRNONAA >60 11/04/2020 0311    Lipase     Component Value Date/Time   LIPASE 33 11/01/2020 0954       Studies/Results: No results found.  Anti-infectives: Anti-infectives (From admission, onward)   Start     Dose/Rate Route Frequency Ordered Stop   11/03/20 0600  cefoTEtan (CEFOTAN) 2 g in sodium chloride 0.9 % 100 mL IVPB        2 g 200 mL/hr over 30 Minutes Intravenous To Surgery 11/02/20 1717 11/03/20 1121   11/02/20 1000  ceFAZolin (ANCEF) IVPB 1 g/50 mL premix  Status:  Discontinued        1 g 100 mL/hr over 30 Minutes Intravenous On call to O.R. 11/02/20 0903 11/03/20 0559       Assessment/Plan Fragile X syndrome  SigmoidVolvulus S/p Diagnostic Laparoscopy, sigmoid colectomy - Dr. Carolynne Edouard - 3/17  POD #4 - Path as below - The patient is voiding well, tolerating diet, ambulating well, pain well controlled, vital signs stable, incisions c/d/i and felt stable for discharge home today. Will work on arranging follow up.   FEN -Soft VTE -SCDs, Lovenox  ID -Cefotetan periop. None currently Foley -None Follow-up- Dr. Carolynne Edouard Path -COLON, SIGMOID, COLECTOMY:  - Sessile tubular adenoma, 1.6 cm.  - No high-grade dysplasia or malignancy.  - Subserosal hyperemia, clinically sigmoid volvulus.     LOS: 6 days    Jacinto Halim , [facility] 11/07/2020, 8:51 AM Please  see Amion for pager number during day hours 7:00am-4:30pm

## 2020-11-07 NOTE — Progress Notes (Signed)
Discharge instructions (including medications) discussed with and copy provided to patient/caregiver 

## 2020-11-07 NOTE — Progress Notes (Signed)
PROGRESS NOTE  Brief Narrative: Meredith Walter is a 65 y.o. female with a history of fragile X syndrome who was traveling to [place] from her home in [place] by motor home with her husband when she developed abdominal pain and distention in [place], [place] prompting evaluation in the ED. She was found to have sigmoid volvulus which was reduced endoscopically. She was [facility] for sigmoid colectomy on 3/17 under general anesthesia (no inhalational anesthesia was used to reduce risk of complications due to fragile X syndrome). Postoperative course has been uncomplicated thus far.  Subjective: Taking po, having BM yesterday, still passing flatus. Pain controlled on tylenol alone. Last oxycodone was 3/18.  Objective: BP 132/65 (BP Location: Left Arm)   Pulse 74   Temp 97.7 F (36.5 C) (Oral)   Resp 18   Ht 5' 2.99" (1.6 m)   Wt 50 kg   SpO2 96%   BMI 19.53 kg/m   Gen: Nontoxic, pleasant female in no distress Pulm: Clear and nonlabored on room air. No wheezing  CV: RRR, no murmur, no JVD, no edema GI: Soft, minimally and appropriately tender without distention, +BS  Neuro: Alert and oriented. Gait steady, speech normal. Skin: Midline abdominal incision remains c/d/i with staples in place and well apposed wound edges viewed through honeycomb dressing. No erythema noted.  Assessment & Plan: Fragile X syndrome, chronic constipation, GERD, well-controlled asthma: No changes to home medications recommended at DC. Continue current orders while inpatient.   Sigmoid volvulus s/p endoscopic reduction 3/15 and subsequent sigmoid colectomy 3/17:  - Appreciate surgery taking over primary care for this patient. The patient appears to be doing well.   Tyrone Nine, MD Pager on [facility] 11/07/2020, 7:54 AM

## 2020-11-07 NOTE — Discharge Instructions (Signed)
[facility]      [facility] [phone]  OPEN ABDOMINAL SURGERY: POST OP INSTRUCTIONS  Always review your discharge instruction sheet given to you by the facility where your surgery was performed.  IF YOU HAVE DISABILITY OR FAMILY LEAVE FORMS, YOU MUST BRING THEM TO THE OFFICE FOR PROCESSING.  PLEASE DO NOT GIVE THEM TO YOUR DOCTOR.  1. A prescription for pain medication may be given to you upon discharge.  Take your pain medication as prescribed, if needed.  If narcotic pain medicine is not needed, then you may take acetaminophen (Tylenol) or ibuprofen (Advil) as needed. 2. Take your usually prescribed medications unless otherwise directed. 3. If you need a refill on your pain medication, please contact your pharmacy. They will contact our office to request authorization.  Prescriptions will not be filled after 5pm or on week-ends. 4. You should follow a light diet the first few days after arrival home, such as soup and crackers, pudding, etc.unless your doctor has advised otherwise. A high-fiber, low fat diet can be resumed as tolerated.   Be sure to include lots of fluids daily. Most patients will experience some swelling and bruising on the chest and neck area.  Ice packs will help.  Swelling and bruising can take several days to resolve 5. Most patients will experience some swelling and bruising in the area of the incision. Ice pack will help. Swelling and bruising can take several days to resolve..  6. It is common to experience some constipation if taking pain medication after surgery.  Increasing fluid intake and taking a stool softener will usually help or prevent this problem from occurring.  A mild laxative (Milk of Magnesia or Miralax) should be taken according to package directions if there are no bowel movements after 48 hours. 7.  You may have steri-strips (small skin tapes) in place directly over the incision.  These strips should be left on the skin for 7-10 days.  If your  surgeon used skin glue on the incision, you may shower in 24 hours.  The glue will flake off over the next 2-3 weeks.  Any sutures or staples will be removed at the office during your follow-up visit. You may find that a light gauze bandage over your incision may keep your staples from being rubbed or pulled. You may shower and replace the bandage daily. 8. ACTIVITIES:  You may resume regular (light) daily activities beginning the next day--such as daily self-care, walking, climbing stairs--gradually increasing activities as tolerated.  You may have sexual intercourse when it is comfortable.  Refrain from any heavy lifting or straining until approved by your doctor. a. You may drive when you no longer are taking prescription pain medication, you can comfortably wear a seatbelt, and you can safely maneuver your car and apply brakes b. Return to Work: ___________________________________ 9. You should see your doctor in the office for a follow-up appointment approximately two weeks after your surgery.  Make sure that you call for this appointment within a day or two after you arrive home to insure a convenient appointment time. OTHER INSTRUCTIONS:  _____________________________________________________________ _____________________________________________________________  WHEN TO CALL YOUR DOCTOR: 1. Fever over 101.0 2. Inability to urinate 3. Nausea and/or vomiting 4. Extreme swelling or bruising 5. Continued bleeding from incision. 6. Increased pain, redness, or drainage from the incision. 7. Difficulty swallowing or breathing 8. Muscle cramping or spasms. 9. Numbness or tingling in hands or feet or around lips.  The clinic staff is available to   answer your questions during regular business hours.  Please don't hesitate to call and ask to speak to one of the nurses if you have concerns.  For further questions, please visit [email or web address]   Soft-Food Eating Plan A soft-food  eating plan includes foods that are safe and easy to chew and swallow. Your health care provider or dietitian can help you find foods and flavors that fit into this plan. Follow this plan until your health care provider or dietitian says it is safe to start eating other foods and food textures. What are tips for following this plan? General guidelines  Take small bites of food, or cut food into pieces about  inch or smaller. Bite-sized pieces of food are easier to chew and swallow.  Eat moist foods. Avoid overly dry foods.  Avoid foods that: ? Are difficult to swallow, such as dry, chunky, crispy, or sticky foods. ? Are difficult to chew, such as hard, tough, or stringy foods. ? Contain nuts, seeds, or fruits.  Follow instructions from your dietitian about the types of liquids that are safe for you to swallow. You may be allowed to have: ? Thick liquids only. This includes only liquids that are thicker than honey. ? Thin and thick liquids. This includes all beverages and foods that become liquid at room temperature.  To make thick liquids: ? Purchase a commercial liquid thickening powder. These are available at grocery stores and pharmacies. ? Mix the thickener into liquids according to instructions on the label. ? Purchase ready-made thickened liquids. ? Thicken soup by pureeing, straining to remove chunks, and adding flour, potato flakes, or corn starch. ? Add commercial thickener to foods that become liquid at room temperature, such as milk shakes, yogurt, ice cream, gelatin, and sherbet.  Ask your health care provider whether you need to take a fiber supplement.   Cooking  Cook meats so they stay tender and moist. Use methods like braising, stewing, or baking in liquid.  Cook vegetables and fruit until they are soft enough to be mashed with a fork.  Peel soft, fresh fruits such as peaches, nectarines, and melons.  When making soup, make sure chunks of meat and vegetables are  smaller than  inch.  Reheat leftover foods slowly so that a tough crust does not form. What foods are allowed? The items listed below may not be a complete list. Talk with your dietitian about what dietary choices are best for you. Grains Breads, muffins, pancakes, or waffles moistened with syrup, jelly, or butter. Dry cereals well-moistened with milk. Moist, cooked cereals. Well-cooked pasta and rice. Vegetables All soft-cooked vegetables. Shredded lettuce. Fruits All canned and cooked fruits. Soft, peeled fresh fruits. Strawberries. Dairy Milk. Cream. Yogurt. Cottage cheese. Soft cheese without the rind. Meats and other protein foods Tender, moist ground meat, poultry, or fish. Meat cooked in gravy or sauces. Eggs. Sweets and desserts Ice cream. Milk shakes. Sherbet. Pudding. Fats and oils Butter. Margarine. Olive, canola, sunflower, and grapeseed oil. Smooth salad dressing. Smooth cream cheese. Mayonnaise. Gravy. What foods are not allowed? The items listed bemay not be a complete list. Talk with your dietitian about what dietary choices are best for you. Grains Coarse or dry cereals, such as bran, granola, and shredded wheat. Tough or chewy crusty breads, such as French bread or baguettes. Breads with nuts, seeds, or fruit. Vegetables All raw vegetables. Cooked corn. Cooked vegetables that are tough or stringy. Tough, crisp, fried potatoes and potato skins. Fruits Fresh fruits with skins or   seeds, or both, such as apples, pears, and grapes. Stringy, high-pulp fruits, such as papaya, pineapple, coconut, and mango. Fruit leather and all dried fruit. Dairy Yogurt with nuts or coconut. Meats and other protein foods Hard, dry sausages. Dry meat, poultry, or fish. Meats with gristle. Fish with bones. Fried meat or fish. Lunch meat and hotdogs. Nuts and seeds. Chunky peanut butter or other nut butters. Sweets and desserts Cakes or cookies that are very dry or chewy. Desserts with dried  fruit, nuts, or coconut. Fried pastries. Very rich pastries. Fats and oils Cream cheese with fruit or nuts. Salad dressings with seeds or chunks. Summary  A soft-food eating plan includes foods that are safe and easy to swallow. Generally, the foods should be soft enough to be mashed with a fork.  Avoid foods that are dry, hard to chew, crunchy, sticky, stringy, or crispy.  Ask your health care provider whether you need to thicken your liquids and if you need to take a fiber supplement. This information is not intended to replace advice given to you by your health care provider. Make sure you discuss any questions you have with your health care provider. Document Revised: 11/27/2018 Document Reviewed: 10/09/2016 Elsevier Patient Education  2021 [employer]  

## 2020-11-07 NOTE — Plan of Care (Signed)
  Problem: Education: Goal: Knowledge of General Education information will improve Description: Including pain rating scale, medication(s)/side effects and non-pharmacologic comfort measures Outcome: Adequate for Discharge   

## 2022-11-15 IMAGING — DX DG ABDOMEN 1V
1 series · 1 of 1 positions shown · non-contrast
Comparison: Portable exam 4099 hours compared to CT abdomen and
pelvis

CLINICAL DATA: Colonic volvulus post flexible sigmoidoscopy

EXAM:
ABDOMEN - 1 VIEW

[abdomen supine]
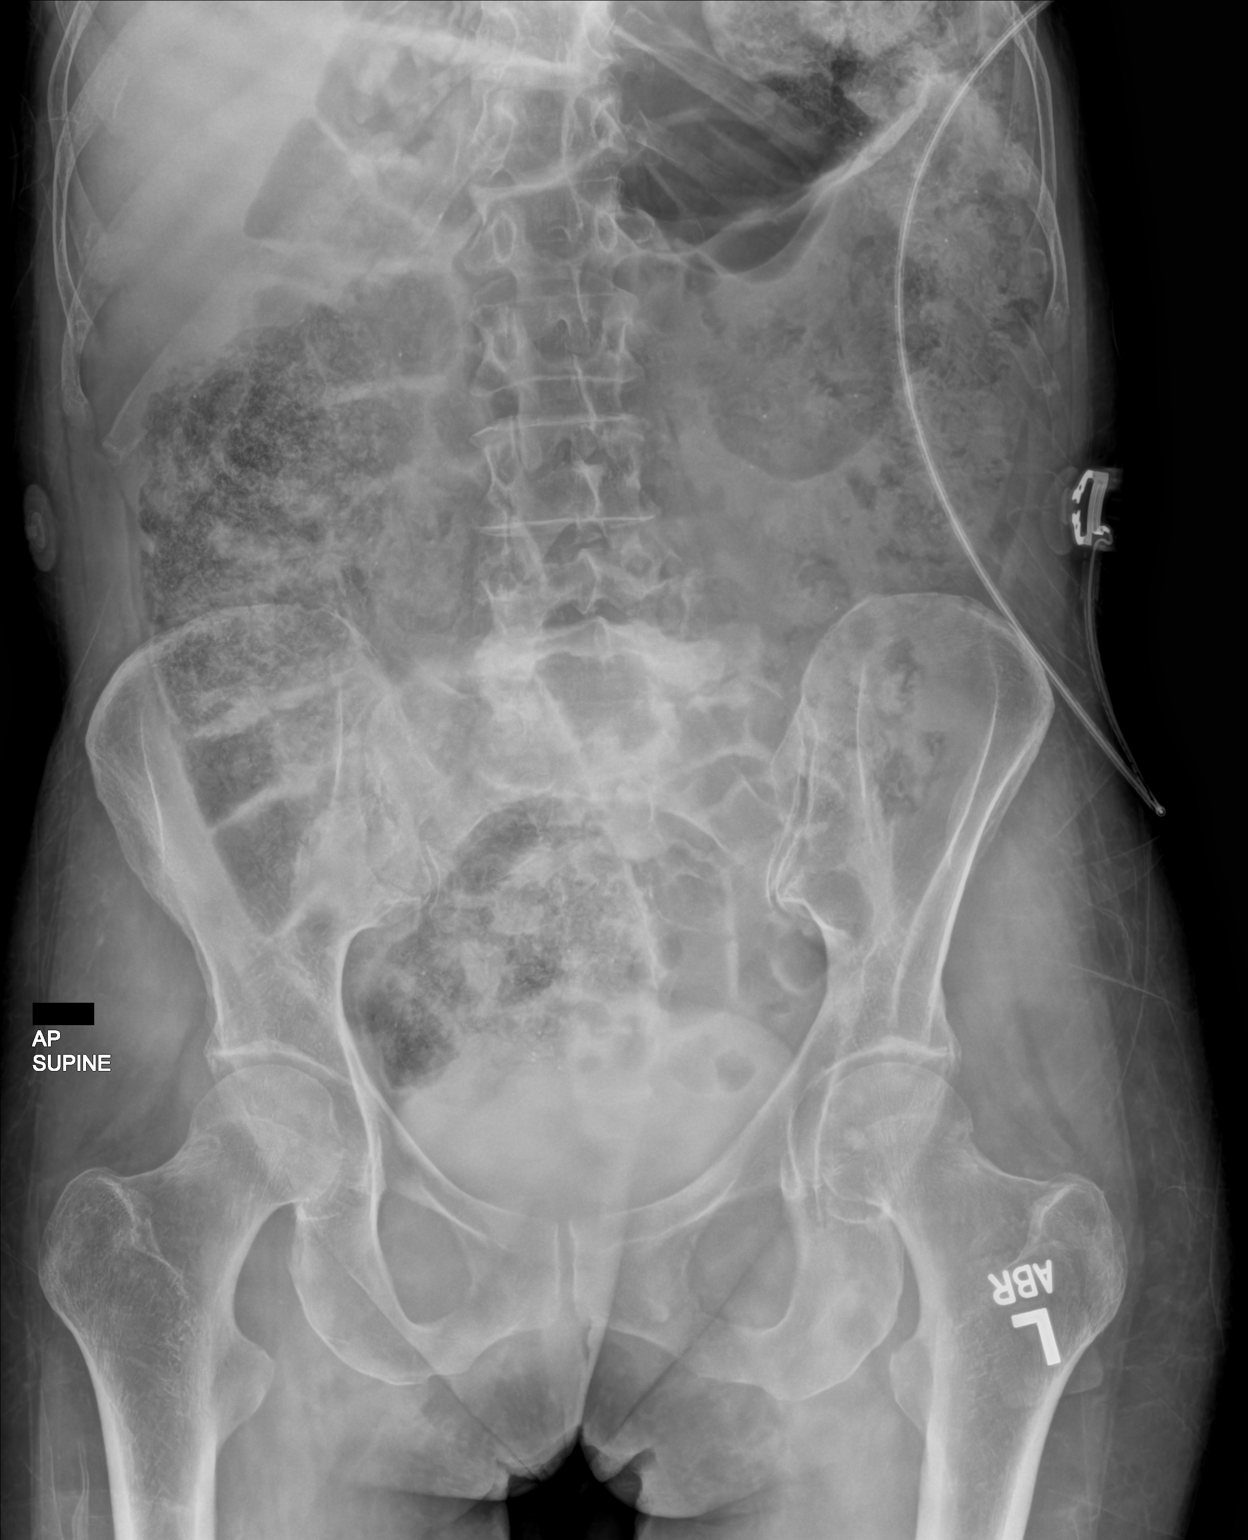

[1 of 1 positions shown; findings below may reference images not displayed]

FINDINGS: Stool throughout colon.

Contrast within urinary bladder.

Colonic distention seen on prior CT exam no longer identified.

Small bowel gas pattern normal.

No definite bowel wall thickening.

Mild osseous demineralization.
IMPRESSION: Interval reduction of sigmoid volvulus and decompression of the
colon.
